# Patient Record
Sex: Male | Born: 1950
Health system: Southern US, Community
[De-identification: ages and names within clinical notes are randomized; demographics above are authoritative.]

---

## 2008-05-02 ENCOUNTER — Encounter: Admission: RE | Admit: 2008-05-02 | Discharge: 2008-05-02 | Payer: Self-pay | Admitting: Family Medicine

## 2015-08-21 DIAGNOSIS — H1032 Unspecified acute conjunctivitis, left eye: Secondary | ICD-10-CM | POA: Diagnosis not present

## 2015-11-09 DIAGNOSIS — I10 Essential (primary) hypertension: Secondary | ICD-10-CM | POA: Diagnosis not present

## 2015-11-09 DIAGNOSIS — Z0000 Encounter for general adult medical examination without abnormal findings: Secondary | ICD-10-CM | POA: Diagnosis not present

## 2015-11-09 DIAGNOSIS — Z125 Encounter for screening for malignant neoplasm of prostate: Secondary | ICD-10-CM | POA: Diagnosis not present

## 2015-11-09 DIAGNOSIS — Z23 Encounter for immunization: Secondary | ICD-10-CM | POA: Diagnosis not present

## 2015-11-09 DIAGNOSIS — E7800 Pure hypercholesterolemia, unspecified: Secondary | ICD-10-CM | POA: Diagnosis not present

## 2015-11-09 DIAGNOSIS — L918 Other hypertrophic disorders of the skin: Secondary | ICD-10-CM | POA: Diagnosis not present

## 2016-02-16 DIAGNOSIS — Z23 Encounter for immunization: Secondary | ICD-10-CM | POA: Diagnosis not present

## 2016-05-10 DIAGNOSIS — I10 Essential (primary) hypertension: Secondary | ICD-10-CM | POA: Diagnosis not present

## 2016-05-10 DIAGNOSIS — E7800 Pure hypercholesterolemia, unspecified: Secondary | ICD-10-CM | POA: Diagnosis not present

## 2016-05-10 DIAGNOSIS — L918 Other hypertrophic disorders of the skin: Secondary | ICD-10-CM | POA: Diagnosis not present

## 2016-05-17 DIAGNOSIS — H35413 Lattice degeneration of retina, bilateral: Secondary | ICD-10-CM | POA: Diagnosis not present

## 2016-05-17 DIAGNOSIS — H31009 Unspecified chorioretinal scars, unspecified eye: Secondary | ICD-10-CM | POA: Diagnosis not present

## 2016-05-17 DIAGNOSIS — H33313 Horseshoe tear of retina without detachment, bilateral: Secondary | ICD-10-CM | POA: Diagnosis not present

## 2016-05-17 DIAGNOSIS — H43811 Vitreous degeneration, right eye: Secondary | ICD-10-CM | POA: Diagnosis not present

## 2016-06-02 DIAGNOSIS — L918 Other hypertrophic disorders of the skin: Secondary | ICD-10-CM | POA: Diagnosis not present

## 2016-06-02 DIAGNOSIS — B078 Other viral warts: Secondary | ICD-10-CM | POA: Diagnosis not present

## 2016-11-10 DIAGNOSIS — E7800 Pure hypercholesterolemia, unspecified: Secondary | ICD-10-CM | POA: Diagnosis not present

## 2016-11-10 DIAGNOSIS — Z23 Encounter for immunization: Secondary | ICD-10-CM | POA: Diagnosis not present

## 2016-11-10 DIAGNOSIS — Z0000 Encounter for general adult medical examination without abnormal findings: Secondary | ICD-10-CM | POA: Diagnosis not present

## 2016-11-10 DIAGNOSIS — Z125 Encounter for screening for malignant neoplasm of prostate: Secondary | ICD-10-CM | POA: Diagnosis not present

## 2016-11-10 DIAGNOSIS — I10 Essential (primary) hypertension: Secondary | ICD-10-CM | POA: Diagnosis not present

## 2017-01-06 DIAGNOSIS — Z961 Presence of intraocular lens: Secondary | ICD-10-CM | POA: Diagnosis not present

## 2017-01-06 DIAGNOSIS — H35373 Puckering of macula, bilateral: Secondary | ICD-10-CM | POA: Diagnosis not present

## 2017-01-06 DIAGNOSIS — H35413 Lattice degeneration of retina, bilateral: Secondary | ICD-10-CM | POA: Diagnosis not present

## 2017-01-06 DIAGNOSIS — H43813 Vitreous degeneration, bilateral: Secondary | ICD-10-CM | POA: Diagnosis not present

## 2017-01-26 DIAGNOSIS — Z23 Encounter for immunization: Secondary | ICD-10-CM | POA: Diagnosis not present

## 2017-04-06 DIAGNOSIS — J069 Acute upper respiratory infection, unspecified: Secondary | ICD-10-CM | POA: Diagnosis not present

## 2017-04-12 DIAGNOSIS — J069 Acute upper respiratory infection, unspecified: Secondary | ICD-10-CM | POA: Diagnosis not present

## 2017-05-16 DIAGNOSIS — H43391 Other vitreous opacities, right eye: Secondary | ICD-10-CM | POA: Diagnosis not present

## 2017-05-16 DIAGNOSIS — H35413 Lattice degeneration of retina, bilateral: Secondary | ICD-10-CM | POA: Diagnosis not present

## 2017-05-16 DIAGNOSIS — H43811 Vitreous degeneration, right eye: Secondary | ICD-10-CM | POA: Diagnosis not present

## 2017-05-16 DIAGNOSIS — H33313 Horseshoe tear of retina without detachment, bilateral: Secondary | ICD-10-CM | POA: Diagnosis not present

## 2017-05-16 DIAGNOSIS — H31009 Unspecified chorioretinal scars, unspecified eye: Secondary | ICD-10-CM | POA: Diagnosis not present

## 2017-05-18 DIAGNOSIS — E7800 Pure hypercholesterolemia, unspecified: Secondary | ICD-10-CM | POA: Diagnosis not present

## 2017-05-18 DIAGNOSIS — I10 Essential (primary) hypertension: Secondary | ICD-10-CM | POA: Diagnosis not present

## 2017-05-18 DIAGNOSIS — R739 Hyperglycemia, unspecified: Secondary | ICD-10-CM | POA: Diagnosis not present

## 2017-07-31 DIAGNOSIS — K5792 Diverticulitis of intestine, part unspecified, without perforation or abscess without bleeding: Secondary | ICD-10-CM | POA: Diagnosis not present

## 2017-07-31 DIAGNOSIS — R109 Unspecified abdominal pain: Secondary | ICD-10-CM | POA: Diagnosis not present

## 2017-12-19 DIAGNOSIS — Z23 Encounter for immunization: Secondary | ICD-10-CM | POA: Diagnosis not present

## 2017-12-19 DIAGNOSIS — E7800 Pure hypercholesterolemia, unspecified: Secondary | ICD-10-CM | POA: Diagnosis not present

## 2017-12-19 DIAGNOSIS — Z0000 Encounter for general adult medical examination without abnormal findings: Secondary | ICD-10-CM | POA: Diagnosis not present

## 2017-12-19 DIAGNOSIS — I10 Essential (primary) hypertension: Secondary | ICD-10-CM | POA: Diagnosis not present

## 2017-12-19 DIAGNOSIS — I447 Left bundle-branch block, unspecified: Secondary | ICD-10-CM | POA: Diagnosis not present

## 2017-12-19 DIAGNOSIS — R7303 Prediabetes: Secondary | ICD-10-CM | POA: Diagnosis not present

## 2017-12-19 DIAGNOSIS — Z125 Encounter for screening for malignant neoplasm of prostate: Secondary | ICD-10-CM | POA: Diagnosis not present

## 2018-01-04 DIAGNOSIS — H35373 Puckering of macula, bilateral: Secondary | ICD-10-CM | POA: Diagnosis not present

## 2018-01-04 DIAGNOSIS — H35413 Lattice degeneration of retina, bilateral: Secondary | ICD-10-CM | POA: Diagnosis not present

## 2018-01-04 DIAGNOSIS — H26493 Other secondary cataract, bilateral: Secondary | ICD-10-CM | POA: Diagnosis not present

## 2018-01-04 DIAGNOSIS — H43813 Vitreous degeneration, bilateral: Secondary | ICD-10-CM | POA: Diagnosis not present

## 2018-01-04 DIAGNOSIS — H524 Presbyopia: Secondary | ICD-10-CM | POA: Diagnosis not present

## 2018-05-22 DIAGNOSIS — H43811 Vitreous degeneration, right eye: Secondary | ICD-10-CM | POA: Diagnosis not present

## 2018-05-22 DIAGNOSIS — H35413 Lattice degeneration of retina, bilateral: Secondary | ICD-10-CM | POA: Diagnosis not present

## 2018-05-22 DIAGNOSIS — H43391 Other vitreous opacities, right eye: Secondary | ICD-10-CM | POA: Diagnosis not present

## 2018-05-22 DIAGNOSIS — H33313 Horseshoe tear of retina without detachment, bilateral: Secondary | ICD-10-CM | POA: Diagnosis not present

## 2018-05-22 DIAGNOSIS — H31009 Unspecified chorioretinal scars, unspecified eye: Secondary | ICD-10-CM | POA: Diagnosis not present

## 2018-05-22 DIAGNOSIS — H43812 Vitreous degeneration, left eye: Secondary | ICD-10-CM | POA: Diagnosis not present

## 2018-06-25 ENCOUNTER — Other Ambulatory Visit: Payer: Self-pay

## 2018-06-25 ENCOUNTER — Other Ambulatory Visit: Payer: Self-pay | Admitting: Family Medicine

## 2018-06-25 ENCOUNTER — Ambulatory Visit
Admission: RE | Admit: 2018-06-25 | Discharge: 2018-06-25 | Disposition: A | Discharge status: Home / Self Care | Payer: Medicare HMO | Source: Ambulatory Visit | Attending: Family Medicine | Admitting: Family Medicine

## 2018-06-25 DIAGNOSIS — M25512 Pain in left shoulder: Secondary | ICD-10-CM | POA: Diagnosis not present

## 2018-06-25 DIAGNOSIS — L1230 Acquired epidermolysis bullosa, unspecified: Secondary | ICD-10-CM | POA: Diagnosis not present

## 2018-06-25 DIAGNOSIS — E7800 Pure hypercholesterolemia, unspecified: Secondary | ICD-10-CM | POA: Diagnosis not present

## 2018-06-25 DIAGNOSIS — I10 Essential (primary) hypertension: Secondary | ICD-10-CM | POA: Diagnosis not present

## 2018-06-25 DIAGNOSIS — R7303 Prediabetes: Secondary | ICD-10-CM | POA: Diagnosis not present

## 2018-12-25 DIAGNOSIS — Z23 Encounter for immunization: Secondary | ICD-10-CM | POA: Diagnosis not present

## 2018-12-25 DIAGNOSIS — Z125 Encounter for screening for malignant neoplasm of prostate: Secondary | ICD-10-CM | POA: Diagnosis not present

## 2018-12-25 DIAGNOSIS — E7800 Pure hypercholesterolemia, unspecified: Secondary | ICD-10-CM | POA: Diagnosis not present

## 2018-12-25 DIAGNOSIS — I10 Essential (primary) hypertension: Secondary | ICD-10-CM | POA: Diagnosis not present

## 2018-12-25 DIAGNOSIS — R7303 Prediabetes: Secondary | ICD-10-CM | POA: Diagnosis not present

## 2018-12-25 DIAGNOSIS — Z0000 Encounter for general adult medical examination without abnormal findings: Secondary | ICD-10-CM | POA: Diagnosis not present

## 2018-12-25 DIAGNOSIS — M25512 Pain in left shoulder: Secondary | ICD-10-CM | POA: Diagnosis not present

## 2019-01-02 ENCOUNTER — Encounter: Payer: Self-pay | Admitting: Physician Assistant

## 2019-01-02 ENCOUNTER — Ambulatory Visit (INDEPENDENT_AMBULATORY_CARE_PROVIDER_SITE_OTHER): Payer: Medicare HMO | Admitting: Physician Assistant

## 2019-01-02 DIAGNOSIS — G8929 Other chronic pain: Secondary | ICD-10-CM | POA: Diagnosis not present

## 2019-01-02 DIAGNOSIS — M25512 Pain in left shoulder: Secondary | ICD-10-CM | POA: Diagnosis not present

## 2019-01-02 MED ORDER — METHYLPREDNISOLONE ACETATE 40 MG/ML IJ SUSP
40.0000 mg | INTRAMUSCULAR | Status: AC | PRN
  Administered 2019-01-02: 40 mg via INTRA_ARTICULAR

## 2019-01-02 MED ORDER — LIDOCAINE HCL 1 % IJ SOLN
3.0000 mL | INTRAMUSCULAR | Status: AC | PRN
  Administered 2019-01-02: 3 mL

## 2019-01-02 NOTE — Progress Notes (Signed)
Office Visit Note   Patient: Juan Moore           Date of Birth: 08-11-1950           MRN: [id number] Visit Date: 01/02/2019              Requested by: Shirline Frees, MD [address],  [place] [address] PCP: Shirline Frees, MD   Assessment & Plan: Visit Diagnoses:  1. Chronic left shoulder pain     Plan:   Discussed with Ed that we will see how the injection does in regards to his pain.  He is given wall crawls, pendulum, Codman and forward flexion exercises performed.  If his pain does not resolve or return shortly after the injection recommend MRI to evaluate the rotator cuff.  Questions encouraged and answered.  Follow-Up Instructions: Return if symptoms worsen or fail to improve.   Orders:  Orders Placed This Encounter  Procedures  . Large Joint Inj: L subacromial bursa   No orders of the defined types were placed in this encounter.     Procedures: Large Joint Inj: L subacromial bursa on 01/02/2019 5:03 PM Indications: pain Details: 22 G 1.5 in needle, superior approach  Arthrogram: No  Medications: 3 mL lidocaine 1 %; 40 mg methylPREDNISolone acetate 40 MG/ML Outcome: tolerated well, no immediate complications Procedure, treatment alternatives, risks and benefits explained, specific risks discussed. Consent was given by the patient. Immediately prior to procedure a time out was called to verify the correct patient, procedure, equipment, support staff and site/side marked as required. Patient was prepped and draped in the usual sterile fashion.       Clinical Data: No additional findings.   Subjective: Chief Complaint  Patient presents with  . Left Shoulder - Pain    HPI And is a 68 year old male well-known to me personally.  Comes in today with left shoulder pain is been ongoing for several months now at least since March.  He had radiographs at his primary care physician which I was able to review these were done back in March  3 views of the shoulder show no acute fracture no bony abnormalities.  Shoulders well located.  Glenohumeral joint appears well maintained.  States that the left shoulder pain began after his wife was lying on his arm for a while.  No really numbness or tingling down the arm.  Pain is mostly in the shoulder girdle.  He is having no neck pain.  States he has good range of motion but extremes will cause some discomfort.  Review of Systems No fevers chills shortness of breath chest pain  Objective: Vital Signs: There were no vitals taken for this visit.  Physical Exam Constitutional:      Appearance: He is not ill-appearing or diaphoretic.  Pulmonary:     Effort: Pulmonary effort is normal.  Neurological:     Mental Status: He is alert and oriented to person, place, and time.  Psychiatric:        Mood and Affect: Mood normal.     Ortho Exam Positive impingement left shoulder.  5 out of 5 strength with external and internal rotation is resistance.  Empty can test is negative bilaterally.  Liftoff test negative bilaterally. Specialty Comments:  No specialty comments available.  Imaging: No results found.   PMFS History: There are no active problems to display for this patient.  History reviewed. No pertinent past medical history.  History reviewed. No pertinent family  history.  History reviewed. No pertinent surgical history. Social History   Occupational History  . Not on file  Tobacco Use  . Smoking status: Not on file  Substance and Sexual Activity  . Alcohol use: Not on file  . Drug use: Not on file  . Sexual activity: Not on file

## 2019-01-08 DIAGNOSIS — H35363 Drusen (degenerative) of macula, bilateral: Secondary | ICD-10-CM | POA: Diagnosis not present

## 2019-01-08 DIAGNOSIS — H35413 Lattice degeneration of retina, bilateral: Secondary | ICD-10-CM | POA: Diagnosis not present

## 2019-01-08 DIAGNOSIS — H33313 Horseshoe tear of retina without detachment, bilateral: Secondary | ICD-10-CM | POA: Diagnosis not present

## 2019-01-08 DIAGNOSIS — H524 Presbyopia: Secondary | ICD-10-CM | POA: Diagnosis not present

## 2019-01-08 DIAGNOSIS — H35372 Puckering of macula, left eye: Secondary | ICD-10-CM | POA: Diagnosis not present

## 2019-04-09 DIAGNOSIS — L570 Actinic keratosis: Secondary | ICD-10-CM | POA: Diagnosis not present

## 2019-04-09 DIAGNOSIS — L989 Disorder of the skin and subcutaneous tissue, unspecified: Secondary | ICD-10-CM | POA: Diagnosis not present

## 2019-04-09 DIAGNOSIS — L821 Other seborrheic keratosis: Secondary | ICD-10-CM | POA: Diagnosis not present

## 2019-05-10 ENCOUNTER — Ambulatory Visit: Payer: Medicare HMO

## 2019-05-18 ENCOUNTER — Ambulatory Visit: Payer: Medicare HMO | Attending: Internal Medicine

## 2019-05-18 DIAGNOSIS — Z23 Encounter for immunization: Secondary | ICD-10-CM | POA: Insufficient documentation

## 2019-05-18 NOTE — Progress Notes (Signed)
   Covid-19 Vaccination Clinic  Name:  Juan Moore    MRN: [id number] DOB: 06-May-1950  05/18/2019  Juan Moore was observed post Covid-19 immunization for 15 minutes without incidence. He was provided with Vaccine Information Sheet and instruction to access the V-Safe system.   Juan Moore was instructed to call 911 with any severe reactions post vaccine: Marland Kitchen Difficulty breathing  . Swelling of your face and throat  . A fast heartbeat  . A bad rash all over your body  . Dizziness and weakness    Immunizations Administered    Name Date Dose VIS Date Route   Pfizer COVID-19 Vaccine 05/18/2019  4:30 PM 0.3 mL 03/22/2019 Intramuscular   Manufacturer: [employer], [employer]   Lot: [id number]   NDC: [address]-7

## 2019-05-31 ENCOUNTER — Ambulatory Visit: Payer: Medicare HMO

## 2019-06-12 ENCOUNTER — Ambulatory Visit: Payer: Medicare HMO | Attending: Internal Medicine

## 2019-06-12 DIAGNOSIS — Z23 Encounter for immunization: Secondary | ICD-10-CM | POA: Insufficient documentation

## 2019-06-12 NOTE — Progress Notes (Signed)
   Covid-19 Vaccination Clinic  Name:  Juan Moore    MRN: [id number] DOB: November 22, 1950  06/12/2019  [name] was observed post Covid-19 immunization for 15 minutes without incident. He was provided with Vaccine Information Sheet and instruction to access the V-Safe system.   [name] was instructed to call 911 with any severe reactions post vaccine: Marland Kitchen Difficulty breathing  . Swelling of face and throat  . A fast heartbeat  . A bad rash all over body  . Dizziness and weakness   Immunizations Administered    Name Date Dose VIS Date Route   Pfizer COVID-19 Vaccine 06/12/2019 12:47 PM 0.3 mL 03/22/2019 Intramuscular   Manufacturer: [employer], [employer]   Lot: [id number]   NDC: [address]-8

## 2019-11-30 ENCOUNTER — Inpatient Hospital Stay (HOSPITAL_COMMUNITY)
Admission: EM | Admit: 2019-11-30 | Discharge: 2019-12-07 | DRG: 378 | Disposition: A | Discharge status: Home / Self Care | Payer: Medicare HMO | Attending: Internal Medicine | Admitting: Internal Medicine

## 2019-11-30 ENCOUNTER — Other Ambulatory Visit: Payer: Self-pay

## 2019-11-30 ENCOUNTER — Encounter (HOSPITAL_COMMUNITY): Payer: Self-pay | Admitting: Emergency Medicine

## 2019-11-30 DIAGNOSIS — Z7982 Long term (current) use of aspirin: Secondary | ICD-10-CM | POA: Diagnosis not present

## 2019-11-30 DIAGNOSIS — K633 Ulcer of intestine: Secondary | ICD-10-CM | POA: Diagnosis present

## 2019-11-30 DIAGNOSIS — K649 Unspecified hemorrhoids: Secondary | ICD-10-CM | POA: Diagnosis present

## 2019-11-30 DIAGNOSIS — K5731 Diverticulosis of large intestine without perforation or abscess with bleeding: Secondary | ICD-10-CM | POA: Diagnosis not present

## 2019-11-30 DIAGNOSIS — R195 Other fecal abnormalities: Secondary | ICD-10-CM | POA: Diagnosis present

## 2019-11-30 DIAGNOSIS — D72829 Elevated white blood cell count, unspecified: Secondary | ICD-10-CM | POA: Diagnosis present

## 2019-11-30 DIAGNOSIS — I10 Essential (primary) hypertension: Secondary | ICD-10-CM | POA: Diagnosis present

## 2019-11-30 DIAGNOSIS — Z79899 Other long term (current) drug therapy: Secondary | ICD-10-CM

## 2019-11-30 DIAGNOSIS — K559 Vascular disorder of intestine, unspecified: Secondary | ICD-10-CM | POA: Diagnosis present

## 2019-11-30 DIAGNOSIS — I447 Left bundle-branch block, unspecified: Secondary | ICD-10-CM

## 2019-11-30 DIAGNOSIS — Z20822 Contact with and (suspected) exposure to covid-19: Secondary | ICD-10-CM | POA: Diagnosis present

## 2019-11-30 DIAGNOSIS — D62 Acute posthemorrhagic anemia: Secondary | ICD-10-CM | POA: Diagnosis present

## 2019-11-30 DIAGNOSIS — K922 Gastrointestinal hemorrhage, unspecified: Secondary | ICD-10-CM | POA: Diagnosis not present

## 2019-11-30 DIAGNOSIS — K625 Hemorrhage of anus and rectum: Secondary | ICD-10-CM | POA: Diagnosis not present

## 2019-11-30 DIAGNOSIS — E785 Hyperlipidemia, unspecified: Secondary | ICD-10-CM | POA: Diagnosis present

## 2019-11-30 DIAGNOSIS — D649 Anemia, unspecified: Secondary | ICD-10-CM

## 2019-11-30 LAB — CBC
HCT: 35.8 % — ABNORMAL LOW (ref 39.0–52.0)
HCT: 32.1 % — ABNORMAL LOW (ref 39.0–52.0)
HCT: 29.4 % — ABNORMAL LOW (ref 39.0–52.0)
Hemoglobin: 11.7 g/dL — ABNORMAL LOW (ref 13.0–17.0)
Hemoglobin: 10.3 g/dL — ABNORMAL LOW (ref 13.0–17.0)
Hemoglobin: 9.4 g/dL — ABNORMAL LOW (ref 13.0–17.0)
MCH: 29.7 pg (ref 26.0–34.0)
MCH: 29.4 pg (ref 26.0–34.0)
MCH: 29.3 pg (ref 26.0–34.0)
MCHC: 32.7 g/dL (ref 30.0–36.0)
MCHC: 32.1 g/dL (ref 30.0–36.0)
MCHC: 32 g/dL (ref 30.0–36.0)
MCV: 90.9 fL (ref 80.0–100.0)
MCV: 91.7 fL (ref 80.0–100.0)
MCV: 91.6 fL (ref 80.0–100.0)
Platelets: 280 10*3/uL (ref 150–400)
Platelets: 263 10*3/uL (ref 150–400)
Platelets: 238 10*3/uL (ref 150–400)
RBC: 3.94 MIL/uL — ABNORMAL LOW (ref 4.22–5.81)
RBC: 3.5 MIL/uL — ABNORMAL LOW (ref 4.22–5.81)
RBC: 3.21 MIL/uL — ABNORMAL LOW (ref 4.22–5.81)
RDW: 13 % (ref 11.5–15.5)
RDW: 13.1 % (ref 11.5–15.5)
RDW: 13.1 % (ref 11.5–15.5)
WBC: 12.1 10*3/uL — ABNORMAL HIGH (ref 4.0–10.5)
WBC: 12.2 10*3/uL — ABNORMAL HIGH (ref 4.0–10.5)
WBC: 12 10*3/uL — ABNORMAL HIGH (ref 4.0–10.5)
nRBC: 0 % (ref 0.0–0.2)
nRBC: 0 % (ref 0.0–0.2)
nRBC: 0 % (ref 0.0–0.2)

## 2019-11-30 LAB — ABO/RH: ABO/RH(D): B POS

## 2019-11-30 LAB — COMPREHENSIVE METABOLIC PANEL
ALT: 16 U/L (ref 0–44)
AST: 23 U/L (ref 15–41)
Albumin: 4 g/dL (ref 3.5–5.0)
Alkaline Phosphatase: 53 U/L (ref 38–126)
Anion gap: 12 (ref 5–15)
BUN: 16 mg/dL (ref 8–23)
CO2: 25 mmol/L (ref 22–32)
Calcium: 9.6 mg/dL (ref 8.9–10.3)
Chloride: 101 mmol/L (ref 98–111)
Creatinine, Ser: 0.89 mg/dL (ref 0.61–1.24)
GFR calc Af Amer: >60 mL/min (ref >60)
GFR calc non Af Amer: >60 mL/min (ref >60)
Glucose, Bld: 112 mg/dL — ABNORMAL HIGH (ref 70–99)
Potassium: 4.5 mmol/L (ref 3.5–5.1)
Sodium: 138 mmol/L (ref 135–145)
Total Bilirubin: 0.8 mg/dL (ref 0.3–1.2)
Total Protein: 6.8 g/dL (ref 6.5–8.1)

## 2019-11-30 LAB — PROTIME-INR
INR: 1.2 (ref 0.8–1.2)
Prothrombin Time: 14.6 seconds (ref 11.4–15.2)

## 2019-11-30 LAB — APTT: aPTT: 23 seconds — ABNORMAL LOW (ref 24–36)

## 2019-11-30 LAB — SARS CORONAVIRUS 2 BY RT PCR (HOSPITAL ORDER, PERFORMED IN ~~LOC~~ HOSPITAL LAB): SARS Coronavirus 2: NEGATIVE

## 2019-11-30 LAB — POC OCCULT BLOOD, ED: Fecal Occult Bld: POSITIVE — AB

## 2019-11-30 LAB — PREPARE RBC (CROSSMATCH)

## 2019-11-30 MED ORDER — PANTOPRAZOLE SODIUM 40 MG IV SOLR
40.0000 mg | INTRAVENOUS | Status: DC
  Administered 2019-12-01: 40 mg via INTRAVENOUS
  Filled 2019-11-30 (×2): qty 40

## 2019-11-30 MED ORDER — LACTATED RINGERS IV BOLUS
500.0000 mL | Freq: Once | INTRAVENOUS | Status: AC
  Administered 2019-11-30: 500 mL via INTRAVENOUS

## 2019-11-30 MED ORDER — ACETAMINOPHEN 325 MG PO TABS
650.0000 mg | ORAL_TABLET | Freq: Four times a day (QID) | ORAL | Status: DC | PRN
  Administered 2019-12-02 – 2019-12-03 (×2): 650 mg via ORAL
  Filled 2019-11-30 (×2): qty 2

## 2019-11-30 MED ORDER — LACTATED RINGERS IV SOLN: INTRAVENOUS | Status: DC

## 2019-11-30 MED ORDER — SODIUM CHLORIDE 0.9% IV SOLUTION: Freq: Once | INTRAVENOUS | Status: DC

## 2019-11-30 MED ORDER — ACETAMINOPHEN 650 MG RE SUPP: 650.0000 mg | Freq: Four times a day (QID) | RECTAL | Status: DC | PRN

## 2019-11-30 NOTE — ED Triage Notes (Signed)
Pt reports bright red rectal bleeding since Wednesday.  Seen at [facility] today and sent to ED for further eval.  Denies pain.

## 2019-11-30 NOTE — ED Notes (Signed)
Ortho from 13:09 not obtained. Provider notified and stated order can be discontinued.

## 2019-11-30 NOTE — Consult Note (Signed)
Referring Provider:  Dr. Anitra Lauth [facility] EDP) Primary Care Physician:  Johny Blamer, MD Primary Gastroenterologist:  Dr. Dulce Sellar  Reason for Consultation: Hematochezia  HPI: Juan Moore is a 69 y.o. male with known pancolonic diverticulosis based on a June 2021 colonoscopy which was otherwise negative, who presented to the [facility] walk-in clinic today with a history of several episodes of painless hematochezia which began 3 nights ago, associated with orthostatic symptomatology.    I spoke with the walk-in clinic physician and recommended referral to the [facility] emergency room.  After a 7-hour wait, he was taken back to a room, having had in the meantime multiple additional episodes of hematochezia (most recently about 15 minutes ago), and with a drop in his hemoglobin to 10.3 (as compared to a baseline of 15.1 when checked as an outpatient a year ago).   No abdominal pain.  No history of antecedent constipation to suggest stercoral ulceration.  No use of nonsteroidal anti-inflammatory drugs to have caused colonic ulceration.  The patient is on a daily 81 mg aspirin without PPI coverage but his BUN on admission was 16 which would argue against an upper tract source, as would the fact that his stool is not melenic in character.  No prodromal dyspeptic symptomatology.  Platelets normal, no known liver disease, no anticoagulant therapy, no prior history of GI bleeding other than some self-limited hemorrhoidal bleeding evaluated in the ER about 5 years ago.   History reviewed. No pertinent past medical history.  History reviewed. No pertinent surgical history.  Prior to Admission medications   Medication Sig Start Date End Date Taking? Authorizing Provider  acetaminophen (TYLENOL) 500 MG tablet Take 1,000 mg by mouth every 6 (six) hours as needed for headache (pain).   Yes [provider]  ascorbic acid (VITAMIN C) 500 MG tablet Take 500 mg by mouth daily.   Yes [provider]   aspirin EC 81 MG tablet Take 81 mg by mouth daily. Swallow whole.   Yes [provider]  benazepril-hydrochlorthiazide (LOTENSIN HCT) 10-12.5 MG tablet Take 1 tablet by mouth daily.  08/09/18  Yes [provider]  hydrocortisone (ANUSOL-HC) 25 MG suppository Place 25 mg rectally 2 (two) times daily. 11/28/19  Yes [provider]  Menthol, Topical Analgesic, (ICY HOT EX) Apply 1 application topically daily as needed (shoulder pain).   Yes [provider]  Multiple Vitamin (MULTIVITAMIN WITH MINERALS) TABS tablet Take 1 tablet by mouth daily.   Yes [provider]  Probiotic Product (ALIGN PO) Take 1 tablet by mouth.   Yes [provider]  simvastatin (ZOCOR) 40 MG tablet Take 40 mg by mouth at bedtime.  08/16/18  Yes [provider]    No current facility-administered medications for this encounter.   Current Outpatient Medications  Medication Sig Dispense Refill  . acetaminophen (TYLENOL) 500 MG tablet Take 1,000 mg by mouth every 6 (six) hours as needed for headache (pain).    Marland Kitchen ascorbic acid (VITAMIN C) 500 MG tablet Take 500 mg by mouth daily.    Marland Kitchen aspirin EC 81 MG tablet Take 81 mg by mouth daily. Swallow whole.    . benazepril-hydrochlorthiazide (LOTENSIN HCT) 10-12.5 MG tablet Take 1 tablet by mouth daily.     . hydrocortisone (ANUSOL-HC) 25 MG suppository Place 25 mg rectally 2 (two) times daily.    . Menthol, Topical Analgesic, (ICY HOT EX) Apply 1 application topically daily as needed (shoulder pain).    . Multiple Vitamin (MULTIVITAMIN WITH  MINERALS) TABS tablet Take 1 tablet by mouth daily.    . Probiotic Product (ALIGN PO) Take 1 tablet by mouth.    . simvastatin (ZOCOR) 40 MG tablet Take 40 mg by mouth at bedtime.       Allergies as of 11/30/2019  . (No Known Allergies)    No family history on file.  Social History   Socioeconomic History  . Marital status: Unknown    Spouse name: Not on file  . Number of  children: Not on file  . Years of education: Not on file  . Highest education level: Not on file  Occupational History  . Not on file  Tobacco Use  . Smoking status: Never Smoker  . Smokeless tobacco: Never Used  Substance and Sexual Activity  . Alcohol use: Not Currently  . Drug use: Not Currently  . Sexual activity: Not on file  Other Topics Concern  . Not on file  Social History Narrative  . Not on file   Social Determinants of Health   Financial Resource Strain:   . Difficulty of Paying Living Expenses: Not on file  Food Insecurity:   . Worried About Programme researcher, broadcasting/film/video in the Last Year: Not on file  . Ran Out of Food in the Last Year: Not on file  Transportation Needs:   . Lack of Transportation (Medical): Not on file  . Lack of Transportation (Non-Medical): Not on file  Physical Activity:   . Days of Exercise per Week: Not on file  . Minutes of Exercise per Session: Not on file  Stress:   . Feeling of Stress : Not on file  Social Connections:   . Frequency of Communication with Friends and Family: Not on file  . Frequency of Social Gatherings with Friends and Family: Not on file  . Attends Religious Services: Not on file  . Active Member of Clubs or Organizations: Not on file  . Attends Banker Meetings: Not on file  . Marital Status: Not on file  Intimate Partner Violence:   . Fear of Current or Ex-Partner: Not on file  . Emotionally Abused: Not on file  . Physically Abused: Not on file  . Sexually Abused: Not on file    Review of Systems: See HPI  Physical Exam: Vital signs in last 24 hours: Temp:  [98.4 F (36.9 C)] 98.4 F (36.9 C) (08/21 1304) Pulse Rate:  [87-128] 128 (08/21 2040) Resp:  [16-18] 18 (08/21 2040) BP: (105-130)/(76-95) 105/76 (08/21 2040) SpO2:  [96 %-99 %] 98 % (08/21 2040) Weight:  [93 kg] 93 kg (08/21 1304)   General:   Alert,  Well-developed, well-nourished, pleasant and cooperative in NAD Head:  Normocephalic and  atraumatic. Eyes:  Sclera clear, no icterus.   Conjunctiva pink. Mouth:   No ulcerations or lesions.  Oropharynx pink & moist. Neck:   No masses or thyromegaly. Lungs:  Clear throughout to auscultation.   No wheezes, crackles, or rhonchi. No evident respiratory distress. Heart:   Regular rate and rhythm; no murmurs, clicks, rubs,  or gallops. Abdomen:  Soft, nontender, nontympanitic, and nondistended. No masses, hepatosplenomegaly or ventral hernias noted. Msk:   Symmetrical without gross deformities. Pulses:  Normal radial pulse is noted. Extremities:   Without clubbing, cyanosis, or edema. Neurologic:  Alert and coherent;  grossly normal neurologically. Skin:  Intact without significant lesions or rashes.  Warm and well perfused. Cervical Nodes:  No significant cervical adenopathy. Psych:   Alert and cooperative. Normal  mood and affect.  Intake/Output from previous day: No intake/output data recorded. Intake/Output this shift: No intake/output data recorded.  Lab Results: Recent Labs    11/30/19 1316 11/30/19 2021  WBC 12.1* 12.2*  HGB 11.7* 10.3*  HCT 35.8* 32.1*  PLT 280 263   BMET Recent Labs    11/30/19 1316  NA 138  K 4.5  CL 101  CO2 25  GLUCOSE 112*  BUN 16  CREATININE 0.89  CALCIUM 9.6   LFT Recent Labs    11/30/19 1316  PROT 6.8  ALBUMIN 4.0  AST 23  ALT 16  ALKPHOS 53  BILITOT 0.8   PT/INR No results for input(s): LABPROT, INR in the last 72 hours.  Studies/Results: No results found.  Impression: Very compatible presentation for active diverticular bleeding with mild incipient hemodynamic instability and moderate acute posthemorrhagic anemia.  No evidence of upper tract source with normal BUN, despite low-dose aspirin therapy.  Alternative sources of lower GI bleeding, such as vascular ectasia, colonic neoplasia, ischemic colitis or colonic ulcerations do not fit the history or clinical presentation appropriately.  Plan: 1.  Nuclear medicine  bleeding scan; if active bleeding is identified, proceed to IR embolization (renal function normal)  2.  Meanwhile, supportive care with IV fluids  3.  I have ordered standard dose IV pantoprazole to prophylax against stress gastritis in the setting of recent aspirin use, although I do not think the patient's current bleeding is of upper tract origin.  4.  Lengthy discussion with patient and wife at bedside regarding pathophysiology, natural history, prognosis, and management of diverticular bleeding which is our working diagnosis.  I explained that there is usually not much of a role for colonoscopy in this setting, but we would consider it if the bleeding persists and/or the diagnosis is in doubt.  We will follow the patient with you.   LOS: 0 days   Katy Fitch Kalum Minner  11/30/2019, 10:05 PM   Pager [phone] If no answer or after 5 PM call [phone]

## 2019-11-30 NOTE — H&P (Signed)
History and Physical    Juan Moore:[id number] DOB: 22-Jul-1950 DOA: 11/30/2019  PCP: Johny Blamer, MD Patient coming from: Home  Chief Complaint: Rectal bleeding  HPI: Juan Moore is a 69 y.o. male with medical history significant of hypertension, hyperlipidemia, pancolonic diverticulosis on colonoscopy done June 2021 per GI note being sent to the ED by [facility] for evaluation of rectal bleeding for the past 3 days.  He takes aspirin 81 mg daily at home.  Patient states he has had multiple episodes of rectal bleeding at home and 3-4 additional episodes since he has been in the ED.  Today he is feeling lightheaded and appears pale.  Denies abdominal or rectal pain.  He has not vomited any blood.  He has been vaccinated against Covid.  Denies fevers, cough, chest pain, or shortness of breath.    ED Course: Patient had additional episodes of hematochezia in the ED.  Tachycardic.  Not hypotensive.  Initial hemoglobin 11.7, repeat 10.3.  Per GI note, his hemoglobin was 15.1 when checked as an outpatient a year ago.  FOBT positive.  Platelet count normal.  SARS-CoV-2 PCR test pending.  He was given a 500 cc LR bolus.  Dr. Matthias Hughs from GI consulted by ED provider and recommended a tagged RBC scan.  Review of Systems:  All systems reviewed and apart from history of presenting illness, are negative.  History reviewed. No pertinent past medical history.  History reviewed. No pertinent surgical history.   reports that he has never smoked. He has never used smokeless tobacco. He reports previous alcohol use. He reports previous drug use.  No Known Allergies  History reviewed. No pertinent family history.  Prior to Admission medications   Medication Sig Start Date End Date Taking? Authorizing Provider  acetaminophen (TYLENOL) 500 MG tablet Take 1,000 mg by mouth every 6 (six) hours as needed for headache (pain).   Yes [provider]  ascorbic acid (VITAMIN C) 500 MG tablet  Take 500 mg by mouth daily.   Yes [provider]  aspirin EC 81 MG tablet Take 81 mg by mouth daily. Swallow whole.   Yes [provider]  benazepril-hydrochlorthiazide (LOTENSIN HCT) 10-12.5 MG tablet Take 1 tablet by mouth daily.  08/09/18  Yes [provider]  hydrocortisone (ANUSOL-HC) 25 MG suppository Place 25 mg rectally 2 (two) times daily. 11/28/19  Yes [provider]  Menthol, Topical Analgesic, (ICY HOT EX) Apply 1 application topically daily as needed (shoulder pain).   Yes [provider]  Multiple Vitamin (MULTIVITAMIN WITH MINERALS) TABS tablet Take 1 tablet by mouth daily.   Yes [provider]  Probiotic Product (ALIGN PO) Take 1 tablet by mouth.   Yes [provider]  simvastatin (ZOCOR) 40 MG tablet Take 40 mg by mouth at bedtime.  08/16/18  Yes [provider]    Physical Exam: Vitals:   11/30/19 1600 11/30/19 1624 11/30/19 1805 11/30/19 2040  BP: 121/83 116/80 (!) 119/95 105/76  Pulse: 87 94 (!) 110 (!) 128  Resp: 16 18 18 18   Temp:      TempSrc:      SpO2: 99% 98% 96% 98%  Weight:      Height:        Physical Exam Constitutional:      General: He is not in acute distress. HENT:     Head: Normocephalic and atraumatic.     Mouth/Throat:     Mouth: Mucous membranes are moist.  Pharynx: Oropharynx is clear.  Eyes:     Extraocular Movements: Extraocular movements intact.     Conjunctiva/sclera: Conjunctivae normal.  Cardiovascular:     Rate and Rhythm: Regular rhythm. Tachycardia present.     Pulses: Normal pulses.     Comments: Slightly tachycardic Pulmonary:     Effort: Pulmonary effort is normal. No respiratory distress.     Breath sounds: Normal breath sounds. No wheezing or rales.  Abdominal:     General: Bowel sounds are normal. There is no distension.     Palpations: Abdomen is soft.     Tenderness: There is no abdominal tenderness. There is no guarding or rebound.    Musculoskeletal:        General: No swelling or tenderness.     Cervical back: Normal range of motion and neck supple.  Skin:    General: Skin is warm and dry.     Coloration: Skin is pale.  Neurological:     General: No focal deficit present.     Mental Status: He is alert and oriented to person, place, and time.     Labs on Admission: I have personally reviewed following labs and imaging studies  CBC: Recent Labs  Lab 11/30/19 1316 11/30/19 2021  WBC 12.1* 12.2*  HGB 11.7* 10.3*  HCT 35.8* 32.1*  MCV 90.9 91.7  PLT 280 263   Basic Metabolic Panel: Recent Labs  Lab 11/30/19 1316  NA 138  K 4.5  CL 101  CO2 25  GLUCOSE 112*  BUN 16  CREATININE 0.89  CALCIUM 9.6   GFR: Estimated Creatinine Clearance: 86 mL/min (by C-G formula based on SCr of 0.89 mg/dL). Liver Function Tests: Recent Labs  Lab 11/30/19 1316  AST 23  ALT 16  ALKPHOS 53  BILITOT 0.8  PROT 6.8  ALBUMIN 4.0   No results for input(s): LIPASE, AMYLASE in the last 168 hours. No results for input(s): AMMONIA in the last 168 hours. Coagulation Profile: No results for input(s): INR, PROTIME in the last 168 hours. Cardiac Enzymes: No results for input(s): CKTOTAL, CKMB, CKMBINDEX, TROPONINI in the last 168 hours. BNP (last 3 results) No results for input(s): PROBNP in the last 8760 hours. HbA1C: No results for input(s): HGBA1C in the last 72 hours. CBG: No results for input(s): GLUCAP in the last 168 hours. Lipid Profile: No results for input(s): CHOL, HDL, LDLCALC, TRIG, CHOLHDL, LDLDIRECT in the last 72 hours. Thyroid Function Tests: No results for input(s): TSH, T4TOTAL, FREET4, T3FREE, THYROIDAB in the last 72 hours. Anemia Panel: No results for input(s): VITAMINB12, FOLATE, FERRITIN, TIBC, IRON, RETICCTPCT in the last 72 hours. Urine analysis: No results found for: COLORURINE, APPEARANCEUR, LABSPEC, PHURINE, GLUCOSEU, HGBUR, BILIRUBINUR, KETONESUR, PROTEINUR, UROBILINOGEN, NITRITE,  LEUKOCYTESUR  Radiological Exams on Admission: No results found.  EKG: Independently reviewed.  Sinus tachycardia, LBBB.  No prior tracing for comparison.  Assessment/Plan Principal Problem:   Acute lower GI bleeding Active Problems:   Acute blood loss anemia   Leukocytosis   Hypertension   Left bundle branch block   Acute symptomatic blood loss anemia secondary to acute lower GI bleed/painless hematochezia: History of pancolonic diverticulosis on colonoscopy done June 2021 per GI note.  Takes aspirin 81 mg daily at home.  He has had multiple episodes of hematochezia at home and continues to have additional episodes in the ED.  Initial hemoglobin 11.7, repeat 10.3.  FOBT positive.  Per GI note, his hemoglobin was 15.1 when checked as an outpatient a year  ago.  Currently tachycardic but not hypotensive.  Patient has been seen by GI and his bleeding is felt to be diverticular in nature.  Recommending tagged RBC scan to see if there is active bleeding, if so IR consultation for embolization. -Keep n.p.o. IV fluid bolus and infusion.  Type and screen, 2 units PRBCs.  Serial H&H.  Tagged RBC scan ordered and pending.  Hold home aspirin.  GI ordered IV PPI for prophylaxis against stress gastritis in the setting of recent aspirin use.  Admit to progressive care unit and monitor hemodynamics very closely.  Mild leukocytosis: Likely reactive.  WBC count 12.1.  No fever or infectious signs/symptoms. -Continue to monitor WBC count, pursue work-up to rule out infectious etiology if no improvement.  Hypertension -Hold antihypertensives at this time  Hyperlipidemia -Resume home statin after resolution of acute GI bleed/when patient is no longer n.p.o.  Left bundle branch block on EKG: No prior EKG for comparison.  Discussed with the patient.  Patient is not endorsing any anginal symptoms.  He reports previously being told that he has a left bundle branch block on his EKG for several years and his PCP  is aware.  States he has also previously been seen by a cardiologist. -Cardiac monitoring  DVT prophylaxis: SCDs Code Status: Full code-discussed with the patient. Family Communication: Wife at bedside. Disposition Plan: Status is: Inpatient  Remains inpatient appropriate because:Hemodynamically unstable, IV treatments appropriate due to intensity of illness or inability to take PO and Inpatient level of care appropriate due to severity of illness   Dispo: The patient is from: Home              Anticipated d/c is to: Home              Anticipated d/c date is: 2 days              Patient currently is not medically stable to d/c.  Consults called: GI (Dr. Matthias Hughs)  The medical decision making on this patient was of high complexity and the patient is at high risk for clinical deterioration, therefore this is a level 3 visit.  John Giovanni MD [facility]  If 7PM-7AM, please contact night-coverage [email or web address]  11/30/2019, 10:41 PM

## 2019-11-30 NOTE — ED Provider Notes (Signed)
[facility] EMERGENCY DEPARTMENT Provider Note   CSN: [id number] Arrival date & time: 11/30/19  1233     History Chief Complaint  Patient presents with  . Rectal Bleeding    Juan Moore is a 69 y.o. male.  The history is provided by the patient.  Rectal Bleeding Quality:  Bright red (pt states started to have painless bright red bleeding on wed.  intermittent and maybe somewhat improved yesterday but worse in the last 24 hours.  6 large bloody BM;s today) Amount:  Copious Duration:  4 days Timing:  Intermittent Chronicity:  New Context: defecation   Similar prior episodes: no   Relieved by:  Nothing Worsened by:  Defecation Ineffective treatments:  Hemorrhoid cream Associated symptoms: light-headedness   Associated symptoms: no abdominal pain, no dizziness, no fever, no loss of consciousness, no recent illness and no vomiting   Risk factors comment:  81mg  ASA but no other anticoagulation.  Colonscopy on 6/21 and normal except for internal hemorrhoids and diverticulosis      History reviewed. No pertinent past medical history.  There are no problems to display for this patient.   History reviewed. No pertinent surgical history.     No family history on file.  Social History   Tobacco Use  . Smoking status: Never Smoker  . Smokeless tobacco: Never Used  Substance Use Topics  . Alcohol use: Not Currently  . Drug use: Not Currently    Home Medications Prior to Admission medications   Medication Sig Start Date End Date Taking? Authorizing Provider  benazepril-hydrochlorthiazide (LOTENSIN HCT) 10-12.5 MG tablet  08/09/18   [provider]  simvastatin (ZOCOR) 40 MG tablet  08/16/18   [provider]    Allergies    Patient has no allergy information on record.  Review of Systems   Review of Systems  Constitutional: Negative for fever.  Gastrointestinal: Positive for hematochezia. Negative for abdominal pain and  vomiting.  Neurological: Positive for light-headedness. Negative for dizziness and loss of consciousness.  All other systems reviewed and are negative.   Physical Exam Updated Vital Signs BP 105/76 (BP Location: Right Arm)   Pulse (!) 128   Temp 98.4 F (36.9 C) (Oral)   Resp 18   Ht 6' (1.829 m)   Wt 93 kg   SpO2 98%   BMI 27.80 kg/m   Physical Exam Vitals and nursing note reviewed.  Constitutional:      General: He is not in acute distress.    Appearance: Normal appearance. He is well-developed and normal weight.  HENT:     Head: Normocephalic and atraumatic.     Mouth/Throat:     Mouth: Mucous membranes are moist.  Eyes:     Conjunctiva/sclera: Conjunctivae normal.     Pupils: Pupils are equal, round, and reactive to light.  Cardiovascular:     Rate and Rhythm: Regular rhythm. Tachycardia present.     Heart sounds: No murmur heard.   Pulmonary:     Effort: Pulmonary effort is normal. Tachypnea present. No respiratory distress.     Breath sounds: Normal breath sounds. No wheezing or rales.     Comments: tachypneic when standing that improves with laying down Abdominal:     General: There is no distension.     Palpations: Abdomen is soft.     Tenderness: There is no abdominal tenderness. There is no guarding or rebound.  Genitourinary:    Rectum: Guaiac result positive.     Comments:  No external hemorrhoids.  Dark blood present on rectal.  No masses or tenderness noted. Musculoskeletal:        General: No tenderness. Normal range of motion.     Cervical back: Normal range of motion and neck supple.  Skin:    General: Skin is warm and dry.     Coloration: Skin is pale.     Findings: No erythema or rash.  Neurological:     General: No focal deficit present.     Mental Status: He is alert and oriented to person, place, and time. Mental status is at baseline.  Psychiatric:        Mood and Affect: Mood normal.        Behavior: Behavior normal.        Thought  Content: Thought content normal.     ED Results / Procedures / Treatments   Labs (all labs ordered are listed, but only abnormal results are displayed) Labs Reviewed  COMPREHENSIVE METABOLIC PANEL - Abnormal; Notable for the following components:      Result Value   Glucose, Bld 112 (*)    All other components within normal limits  CBC - Abnormal; Notable for the following components:   WBC 12.1 (*)    RBC 3.94 (*)    Hemoglobin 11.7 (*)    HCT 35.8 (*)    All other components within normal limits  CBC - Abnormal; Notable for the following components:   WBC 12.2 (*)    RBC 3.50 (*)    Hemoglobin 10.3 (*)    HCT 32.1 (*)    All other components within normal limits  POC OCCULT BLOOD, ED - Abnormal; Notable for the following components:   Fecal Occult Bld POSITIVE (*)    All other components within normal limits  SARS CORONAVIRUS 2 BY RT PCR (HOSPITAL ORDER, PERFORMED IN [facility])  POC OCCULT BLOOD, ED  TYPE AND SCREEN  ABO/RH    EKG None  Radiology No results found.  Procedures Procedures (including critical care time)  Medications Ordered in ED Medications - No data to display  ED Course  I have reviewed the triage vital signs and the nursing notes.  Pertinent labs & imaging results that were available during my care of the patient were reviewed by me and considered in my medical decision making (see chart for details).    MDM Rules/Calculators/A&P                          Patient is a 69 year old male presenting today with lower GI bleeding.  Symptoms have started since Wednesday but became significantly worse in the last 24 hours.  Unfortunately patient has been waiting for the last 9 hours and while in the waiting room has slowly started feeling worse.  Patient has become more tachycardic with some drop in blood pressure is now 105/76.  With standing patient becomes lightheaded and tachycardic.  He also feels short of breath which resolves with  lying down.  He does have dark red blood per rectum.  No abdominal pain.  Does have a history of diverticulosis and internal hemorrhoids and concern for bleed in 1 of these locations.  He does take 81 mg aspirin but no other anticoagulation.  Initial hemoglobin at 1 PM today showed a hemoglobin of 11.7 with mild leukocytosis of 12 and normal platelet count.  Patient was typed and screened and CMP without acute findings.  Hemoccult is  positive.  Will recheck hemoglobin given his prolonged wait and recurrent bowel movements to ensure patient does not need transfusion.  Will admit for further evaluation and consult [facility].  10:14 PM Spoke with Dr. Matthias Hughs who recommended ordering a tagged red blood cell scan due to ongoing active bleeding in the setting or diffuse diverticulitis.  Pt repeat Hb is 10 from 11...8 hours ago.  Will give bolus of fluid due to tachycardia but blood pressure remains >100 systolic.  Will admit for further care.  EKG with LBBB ...unknown if this is old, but no active chest pain.  MDM Number of Diagnoses or Management Options   Amount and/or Complexity of Data Reviewed Clinical lab tests: ordered and reviewed Tests in the radiology section of CPT: ordered and reviewed Tests in the medicine section of CPT: ordered and reviewed Decide to obtain previous medical records or to obtain history from someone other than the patient: yes Obtain history from someone other than the patient: no Review and summarize past medical records: yes Discuss the patient with other providers: yes Independent visualization of images, tracings, or specimens: yes  Risk of Complications, Morbidity, and/or Mortality Presenting problems: high Diagnostic procedures: moderate Management options: low  Patient Progress Patient progress: stable  CRITICAL CARE Performed by: Keegen Heffern Total critical care time: 30 minutes Critical care time was exclusive of separately billable procedures and  treating other patients. Critical care was necessary to treat or prevent imminent or life-threatening deterioration. Critical care was time spent personally by me on the following activities: development of treatment plan with patient and/or surrogate as well as nursing, discussions with consultants, evaluation of patient's response to treatment, examination of patient, obtaining history from patient or surrogate, ordering and performing treatments and interventions, ordering and review of laboratory studies, ordering and review of radiographic studies, pulse oximetry and re-evaluation of patient's condition.  Final Clinical Impression(s) / ED Diagnoses Final diagnoses:  Acute lower GI bleeding  Symptomatic anemia    Rx / DC Orders ED Discharge Orders    None       Gwyneth Sprout, MD 11/30/19 2218

## 2019-11-30 NOTE — ED Notes (Signed)
Patient reports having another episode of bloody stool while in the lobby

## 2019-11-30 NOTE — ED Notes (Addendum)
Pts wife came in and complained that pt had "changed color while sitting out here". Wife was assured that we are aware of her husbands status and have been checking on him regularly. No color change noticeable at this time.

## 2019-12-01 LAB — HEMOGLOBIN AND HEMATOCRIT, BLOOD
HCT: 29.6 % — ABNORMAL LOW (ref 39.0–52.0)
HCT: 26 % — ABNORMAL LOW (ref 39.0–52.0)
Hemoglobin: 9.9 g/dL — ABNORMAL LOW (ref 13.0–17.0)
Hemoglobin: 8.8 g/dL — ABNORMAL LOW (ref 13.0–17.0)

## 2019-12-01 LAB — CBC
HCT: 30 % — ABNORMAL LOW (ref 39.0–52.0)
Hemoglobin: 9.9 g/dL — ABNORMAL LOW (ref 13.0–17.0)
MCH: 28.8 pg (ref 26.0–34.0)
MCHC: 33 g/dL (ref 30.0–36.0)
MCV: 87.2 fL (ref 80.0–100.0)
Platelets: 201 10*3/uL (ref 150–400)
RBC: 3.44 MIL/uL — ABNORMAL LOW (ref 4.22–5.81)
RDW: 14 % (ref 11.5–15.5)
WBC: 8.6 10*3/uL (ref 4.0–10.5)
nRBC: 0 % (ref 0.0–0.2)

## 2019-12-01 LAB — HIV ANTIBODY (ROUTINE TESTING W REFLEX): HIV Screen 4th Generation wRfx: NONREACTIVE

## 2019-12-01 MED ORDER — ONDANSETRON HCL 4 MG PO TABS
4.0000 mg | ORAL_TABLET | Freq: Once | ORAL | Status: AC
  Administered 2019-12-01: 4 mg via ORAL
  Filled 2019-12-01: qty 1

## 2019-12-01 NOTE — Progress Notes (Signed)
PROGRESS NOTE    Juan Moore  NOM:[id number] DOB: [birth year] DOA: 11/30/2019 PCP: Johny Blamer, MD  Outpatient Specialists:   Brief Narrative:  Patient is a 69 year old Caucasian male with past medical history significant for pancolonic diverticulosis, hyperlipidemia and hypertension. Patient was admitted with bright red blood per rectum. 5 to 6 g drop in hemoglobin noted on presentation. Patient has intermittent dizziness. We will continue to monitor H/H and transfuse as needed. GI team is directing care. Concerns for diverticular bleed. Nuclear scan ordered by the GI.   Assessment & Plan:   Principal Problem:   Acute lower GI bleeding Active Problems:   Acute blood loss anemia   Leukocytosis   Hypertension   Left bundle branch block  Acute symptomatic blood loss anemia secondary to acute lower GI bleed/painless hematochezia: -History of pancolonic diverticulosis as per colonoscopy done June 2021 per GI note.   -Prior to admission, patient was on aspirin 81 Mg p.o. once daily.  -Patient presented with couple to several days of bright red blood per rectum.  -As per GI note, last hemoglobin prior to admission was 15.1 g/dL.  -Hemoglobin was 11.7 g/dL on presentation.  -Hemoglobin is down to 9.9 g/dL, but stable. -GI is directing care. -Nuclear scan. -Further management will depend on hospital course.  Mild leukocytosis: -Likely reactive. -Leukocytosis has resolved. -WBC today is 8.6   Hypertension -Low normal range. Continue to hold antihypertensives. s time  Left bundle branch block on EKG:  -No prior EKG for comparison.   -No chest pain no flash pulmonary edema reported.  DVT prophylaxis: SCD Code Status: Full code Family Communication: Wife Disposition Plan: Home eventually   Consultants:   GI  Procedures:   None for now  Antimicrobials:   None   Subjective: Intermittent dizziness  Objective: Vitals:   12/01/19 0541 12/01/19 0655  12/01/19 0725 12/01/19 0752  BP: 124/75 108/74 107/74 113/76  Pulse: 94 91 90 88  Resp: 18 18 18 18   Temp: 98.6 F (37 C) 98.4 F (36.9 C) 97.8 F (36.6 C) 98.2 F (36.8 C)  TempSrc: Oral Oral Oral Other (Comment)  SpO2: 94% 91% 97% 98%  Weight: 97.4 kg     Height:        Intake/Output Summary (Last 24 hours) at 12/01/2019 1048 Last data filed at 12/01/2019 0655 Gross per 24 hour  Intake 997.5 ml  Output --  Net 997.5 ml   Filed Weights   11/30/19 1304 12/01/19 0541  Weight: 93 kg 97.4 kg    Examination:  General exam: Appears calm and comfortable. Respiratory system: Clear to auscultation.  Cardiovascular system: S1 & S2 heard Gastrointestinal system: Abdomen is nondistended, soft and nontender. No organomegaly or masses felt. Normal bowel sounds heard. Central nervous system: Alert and oriented. No focal neurological deficits. Extremities: No leg edema.  Data Reviewed: I have personally reviewed following labs and imaging studies  CBC: Recent Labs  Lab 11/30/19 1316 11/30/19 2021 11/30/19 2243 12/01/19 0736  WBC 12.1* 12.2* 12.0* 8.6  HGB 11.7* 10.3* 9.4* 9.9*  HCT 35.8* 32.1* 29.4* 30.0*  MCV 90.9 91.7 91.6 87.2  PLT 280 263 238 201   Basic Metabolic Panel: Recent Labs  Lab 11/30/19 1316  NA 138  K 4.5  CL 101  CO2 25  GLUCOSE 112*  BUN 16  CREATININE 0.89  CALCIUM 9.6   GFR: Estimated Creatinine Clearance: 94.7 mL/min (by C-G formula based on SCr of 0.89 mg/dL). Liver Function Tests: Recent Labs  Lab  11/30/19 1316  AST 23  ALT 16  ALKPHOS 53  BILITOT 0.8  PROT 6.8  ALBUMIN 4.0   No results for input(s): LIPASE, AMYLASE in the last 168 hours. No results for input(s): AMMONIA in the last 168 hours. Coagulation Profile: Recent Labs  Lab 11/30/19 2218  INR 1.2   Cardiac Enzymes: No results for input(s): CKTOTAL, CKMB, CKMBINDEX, TROPONINI in the last 168 hours. BNP (last 3 results) No results for input(s): PROBNP in the last 8760  hours. HbA1C: No results for input(s): HGBA1C in the last 72 hours. CBG: No results for input(s): GLUCAP in the last 168 hours. Lipid Profile: No results for input(s): CHOL, HDL, LDLCALC, TRIG, CHOLHDL, LDLDIRECT in the last 72 hours. Thyroid Function Tests: No results for input(s): TSH, T4TOTAL, FREET4, T3FREE, THYROIDAB in the last 72 hours. Anemia Panel: No results for input(s): VITAMINB12, FOLATE, FERRITIN, TIBC, IRON, RETICCTPCT in the last 72 hours. Urine analysis: No results found for: COLORURINE, APPEARANCEUR, LABSPEC, PHURINE, GLUCOSEU, HGBUR, BILIRUBINUR, KETONESUR, PROTEINUR, UROBILINOGEN, NITRITE, LEUKOCYTESUR Sepsis Labs: @LABRCNTIP (procalcitonin:4,lacticidven:4)  ) Recent Results (from the past 240 hour(s))  SARS Coronavirus 2 by RT PCR (hospital order, performed in [facility] hospital lab) Nasopharyngeal Nasopharyngeal Swab     Status: None   Collection Time: 11/30/19  9:34 PM   Specimen: Nasopharyngeal Swab  Result Value Ref Range Status   SARS Coronavirus 2 NEGATIVE NEGATIVE Final    Comment: (NOTE) SARS-CoV-2 target nucleic acids are NOT DETECTED.  The SARS-CoV-2 RNA is generally detectable in upper and lower respiratory specimens during the acute phase of infection. The lowest concentration of SARS-CoV-2 viral copies this assay can detect is 250 copies / mL. A negative result does not preclude SARS-CoV-2 infection and should not be used as the sole basis for treatment or other patient management decisions.  A negative result may occur with improper specimen collection / handling, submission of specimen other than nasopharyngeal swab, presence of viral mutation(s) within the areas targeted by this assay, and inadequate number of viral copies (<250 copies / mL). A negative result must be combined with clinical observations, patient history, and epidemiological information.  Fact Sheet for Patients:   12/02/19  Fact Sheet for  Healthcare Providers: BoilerBrush.com.cy  This test is not yet approved or  cleared by the https://pope.com/ FDA and has been authorized for detection and/or diagnosis of SARS-CoV-2 by FDA under an Emergency Use Authorization (EUA).  This EUA will remain in effect (meaning this test can be used) for the duration of the COVID-19 declaration under Section 564(b)(1) of the Act, 21 U.S.C. section 360bbb-3(b)(1), unless the authorization is terminated or revoked sooner.  Performed at [facility], [address], [place], [place]          Radiology Studies: No results found.      Scheduled Meds: . sodium chloride   Intravenous Once  . pantoprazole (PROTONIX) IV  40 mg Intravenous Q24H   Continuous Infusions: . lactated ringers 150 mL/hr at 12/01/19 0931     LOS: 1 day    Time spent: 35 minutes    12/03/19, MD  [facility] Pager #: [phone] 7PM-7AM contact night coverage as above

## 2019-12-01 NOTE — Progress Notes (Signed)
Pt getting Q6 H&H. Latest HGB 8.8 which is down from 9.9 on previous result. Pt has ambulated to bathroom without any further bleeding from rectum. Dr Rachael Darby notified of results. No new orders at this time-will continue to monitor. Dierdre Highman, RN

## 2019-12-01 NOTE — Progress Notes (Signed)
Nuclear medicine bleeding scan apparently not done.  However, decline in hemoglobin has leveled off and is actually slightly improved this morning at 9.9.  Pt has not had any passage of blood for approximately the past 19 hrs.  EXAM: NAD whatsoever. VSS.  Good spirits, alert, wife at bedside.  Skin warm, radial pulse full. Chest clear, heart nl, abd NT.  IMPR:    1. Quiescent GIB, presumed diverticular  2. Post-hemorrhagic anemia, moderate, stable   PLAN:  1. D/c previous plan for bleeding scan in view of spontaneous cessation of bleeding 2. Allow clr liq now, and soft diet later today unless bleeding restarts 3. Decrease IVF in view of po intake and absence of bleeding  Florencia Reasons, M.D. Pager [phone] If no answer or after 5 PM call [phone]

## 2019-12-01 NOTE — Progress Notes (Signed)
Patient went to bathroom to void as he was sitting, no bowel movement but patient reports large amount of bright red blood that "fell out." Pateint had flushed before I got in there. I placed a hat in toilet and informed patient to call prior to going to bathroom so we can assist if this happens in the future. I let Dr. Matthias Hughs be made aware of situation. He stated to call back if this got very frequent like every hour and we also had primary team to assist. Patient kept on clears to see how this progressed. Patient and wife made aware of plan

## 2019-12-02 DIAGNOSIS — I447 Left bundle-branch block, unspecified: Secondary | ICD-10-CM

## 2019-12-02 DIAGNOSIS — I10 Essential (primary) hypertension: Secondary | ICD-10-CM

## 2019-12-02 DIAGNOSIS — D62 Acute posthemorrhagic anemia: Secondary | ICD-10-CM

## 2019-12-02 DIAGNOSIS — D72829 Elevated white blood cell count, unspecified: Secondary | ICD-10-CM

## 2019-12-02 LAB — TYPE AND SCREEN
ABO/RH(D): B POS
Antibody Screen: NEGATIVE
Unit division: 0
Unit division: 0

## 2019-12-02 LAB — BPAM RBC
Blood Product Expiration Date: 202109152359
Blood Product Expiration Date: 202109152359
ISSUE DATE / TIME: 202108220016
ISSUE DATE / TIME: 202108220338
Unit Type and Rh: 7300
Unit Type and Rh: 7300

## 2019-12-02 LAB — HEMOGLOBIN AND HEMATOCRIT, BLOOD
HCT: 25.9 % — ABNORMAL LOW (ref 39.0–52.0)
HCT: 27.4 % — ABNORMAL LOW (ref 39.0–52.0)
HCT: 28.6 % — ABNORMAL LOW (ref 39.0–52.0)
Hemoglobin: 8.5 g/dL — ABNORMAL LOW (ref 13.0–17.0)
Hemoglobin: 9 g/dL — ABNORMAL LOW (ref 13.0–17.0)
Hemoglobin: 9.5 g/dL — ABNORMAL LOW (ref 13.0–17.0)

## 2019-12-02 MED ORDER — POLYETHYLENE GLYCOL 3350 17 G PO PACK
17.0000 g | PACK | Freq: Two times a day (BID) | ORAL | Status: DC
  Administered 2019-12-02 – 2019-12-05 (×7): 17 g via ORAL
  Filled 2019-12-02 (×7): qty 1

## 2019-12-02 MED ORDER — LACTATED RINGERS IV SOLN: INTRAVENOUS | Status: DC

## 2019-12-02 NOTE — Progress Notes (Signed)
PROGRESS NOTE    Juan Moore  WSF:[id number]  DOB: 06-30-1950  PCP: Johny Blamer, MD Admit date:11/30/2019 Chief compliant: bright red blood per rectum Hospital course: 69 year old Caucasian male with past medical history significant for pancolonic diverticulosis, hyperlipidemia and hypertension. Patient was admitted with bright red blood per rectum. 5 to 6 g drop in hemoglobin noted on presentation. Patient has intermittent dizziness. We will continue to monitor H/H and transfuse as needed. GI team is directing care. Concerns for diverticular bleed. Nuclear scan ordered by the GI.   Subjective:  Contacted by primary nurse earlier this morning regarding diet as patient was anxious to eat.  Per GI note yesterday advance diet as tolerated.  Soft diet ordered but on arrival to his room he reported 1 episode of GI bleeding yesterday at 4 PM and stated GI APP who rounded on him earlier recommended to stay n.p.o. until evaluated by gastroenterologist.  No complaints of abdominal pain.  Wife at bedside.  SBP borderline  Objective: Vitals:   12/01/19 1938 12/02/19 0002 12/02/19 0519 12/02/19 0832  BP: 103/73 126/69 (!) 98/58 111/73  Pulse: 96 99 88 85  Resp: 18 18 18 18   Temp: 98.2 F (36.8 C) 98.1 F (36.7 C) 98 F (36.7 C) 98.1 F (36.7 C)  TempSrc: Oral Oral Oral Oral  SpO2: 98% 99% 98% 99%  Weight:   90.2 kg   Height:       No intake or output data in the 24 hours ending 12/02/19 1121 Filed Weights   11/30/19 1304 12/01/19 0541 12/02/19 0519  Weight: 93 kg 97.4 kg 90.2 kg    Physical Examination:  General: Moderately built, no acute distress noted Head ENT: Atraumatic normocephalic, PERRLA, neck supple Heart: S1-S2 heard, regular rate and rhythm, no murmurs.  No leg edema noted Lungs: Equal air entry bilaterally, no rhonchi or rales on exam, no accessory muscle use Abdomen: Bowel sounds heard, soft, nontender, nondistended. No organomegaly.  No CVA  tenderness Extremities: No pedal edema.  No cyanosis or clubbing. Neurological: Awake alert oriented x3, no focal weakness or numbness, strength and sensations to crude touch intact Skin: No wounds or rashes.  Data Reviewed: I have personally reviewed following labs and imaging studies  CBC: Recent Labs  Lab 11/30/19 1316 11/30/19 1316 11/30/19 2021 11/30/19 2021 11/30/19 2243 11/30/19 2243 12/01/19 0736 12/01/19 1625 12/01/19 2210 12/02/19 0432 12/02/19 1010  WBC 12.1*  --  12.2*  --  12.0*  --  8.6  --   --   --   --   HGB 11.7*   < > 10.3*   < > 9.4*   < > 9.9* 9.9* 8.8* 8.5* 9.0*  HCT 35.8*   < > 32.1*   < > 29.4*   < > 30.0* 29.6* 26.0* 25.9* 27.4*  MCV 90.9  --  91.7  --  91.6  --  87.2  --   --   --   --   PLT 280  --  263  --  238  --  201  --   --   --   --    < > = values in this interval not displayed.   Basic Metabolic Panel: Recent Labs  Lab 11/30/19 1316  NA 138  K 4.5  CL 101  CO2 25  GLUCOSE 112*  BUN 16  CREATININE 0.89  CALCIUM 9.6   GFR: Estimated Creatinine Clearance: 86 mL/min (by C-G formula based on SCr of 0.89 mg/dL). Liver  Function Tests: Recent Labs  Lab 11/30/19 1316  AST 23  ALT 16  ALKPHOS 53  BILITOT 0.8  PROT 6.8  ALBUMIN 4.0   No results for input(s): LIPASE, AMYLASE in the last 168 hours. No results for input(s): AMMONIA in the last 168 hours. Coagulation Profile: Recent Labs  Lab 11/30/19 2218  INR 1.2   Cardiac Enzymes: No results for input(s): CKTOTAL, CKMB, CKMBINDEX, TROPONINI in the last 168 hours. BNP (last 3 results) No results for input(s): PROBNP in the last 8760 hours. HbA1C: No results for input(s): HGBA1C in the last 72 hours. CBG: No results for input(s): GLUCAP in the last 168 hours. Lipid Profile: No results for input(s): CHOL, HDL, LDLCALC, TRIG, CHOLHDL, LDLDIRECT in the last 72 hours. Thyroid Function Tests: No results for input(s): TSH, T4TOTAL, FREET4, T3FREE, THYROIDAB in the last 72  hours. Anemia Panel: No results for input(s): VITAMINB12, FOLATE, FERRITIN, TIBC, IRON, RETICCTPCT in the last 72 hours. Sepsis Labs: No results for input(s): PROCALCITON, LATICACIDVEN in the last 168 hours.  Recent Results (from the past 240 hour(s))  SARS Coronavirus 2 by RT PCR (hospital order, performed in [facility] hospital lab) Nasopharyngeal Nasopharyngeal Swab     Status: None   Collection Time: 11/30/19  9:34 PM   Specimen: Nasopharyngeal Swab  Result Value Ref Range Status   SARS Coronavirus 2 NEGATIVE NEGATIVE Final    Comment: (NOTE) SARS-CoV-2 target nucleic acids are NOT DETECTED.  The SARS-CoV-2 RNA is generally detectable in upper and lower respiratory specimens during the acute phase of infection. The lowest concentration of SARS-CoV-2 viral copies this assay can detect is 250 copies / mL. A negative result does not preclude SARS-CoV-2 infection and should not be used as the sole basis for treatment or other patient management decisions.  A negative result may occur with improper specimen collection / handling, submission of specimen other than nasopharyngeal swab, presence of viral mutation(s) within the areas targeted by this assay, and inadequate number of viral copies (<250 copies / mL). A negative result must be combined with clinical observations, patient history, and epidemiological information.  Fact Sheet for Patients:   BoilerBrush.com.cy  Fact Sheet for Healthcare Providers: https://pope.com/  This test is not yet approved or  cleared by the [place] FDA and has been authorized for detection and/or diagnosis of SARS-CoV-2 by FDA under an Emergency Use Authorization (EUA).  This EUA will remain in effect (meaning this test can be used) for the duration of the COVID-19 declaration under Section 564(b)(1) of the Act, 21 U.S.C. section 360bbb-3(b)(1), unless the authorization is terminated or revoked  sooner.  Performed at [facility], [address] [place], [place] [address]       Radiology Studies: No results found.    Scheduled Meds: . polyethylene glycol  17 g Oral BID   Continuous Infusions: . lactated ringers        Assessment/Plan:  Lower GI bleed, probably diverticular: History of pancolonic diverticulosis as per colonoscopy done June 2021 per GI note. Prior to admission, patient was on aspirin 81 Mg p.o. once daily. Patient presented with couple to several days of bright red blood per rectum. GI is directing care.Nuclear scan initially planned but later discontinued in view of spontaneous cessation of bleeding. GI strated patient on CLD yesterday, recommended advancing to soft diet unless bleeding restarts. Discontinue IV fluids and advance diet , d/c IV PPI as GIB felt to be lower.Hgb overnight 9.4->9.9->8.8->8.5-> 9.0, reports 1 episode of  BRBPR yesterday around 4 PM. Will continue IVF/clear liquid diet, follow GI recommendations.  Consider bleeding scan if significant bleeding recurs.  Acutesymptomaticblood loss anemia: secondary to above--As per GI note, last hemoglobin prior to admission was 15.1 g/dL. Hemoglobin was 11.7 g/dL on presentation-now down to 8.5-9.0 g/dL, but stable.Further management will depend on hospital course.  Continue IV fluids and clear liquid diet for now.  Transfuse if drops less than 7.  Mild leukocytosis:Likely reactive.Leukocytosis has resolved.WBC now 8.6  Hypertension-Low normal range BP here in the setting of GIB. Home antihypertensives held at this time.  Continue IV fluids.  Left bundle branch block on EKG:-No prior EKG for comparison.  -No chest pain no flash pulmonary edema reported.   DVT prophylaxis: SCD given GIB Code Status: Full code Family / Patient Communication: Discussed with patient and significant other at bedside.  Discussed with GI Disposition Plan:   Status is: Inpatient  Remains inpatient  appropriate because:IV treatments appropriate due to intensity of illness or inability to take PO   Dispo: The patient is from: Home              Anticipated d/c is to: Home              Anticipated d/c date is: 1 day              Patient currently is not medically stable to d/c.      Will downgrade to telemetry bed. Time spent: 35 minutes     >50% time spent in discussions with care team and coordination of care.    Alessandra Bevels, MD [facility] Pager in [place]  If 7PM-7AM, please contact night-coverage [email or web address] 12/02/2019, 11:21 AM

## 2019-12-02 NOTE — Progress Notes (Signed)
[facility] Gastroenterology Progress Note  Juan Moore 69 y.o. [birth year]  CC: Lower GI bleeding   Subjective: Patient passed a large amount of frank red blood yesterday around 4:30 PM.  Since that time, he has not passed any more blood or had any bowel movements.  Denies any abdominal pain.  Denies nausea, vomiting.  ROS : Review of Systems  Respiratory: Negative for cough and shortness of breath.   Cardiovascular: Negative for chest pain and palpitations.  Gastrointestinal: Positive for blood in stool. Negative for abdominal pain, constipation, diarrhea, heartburn, melena, nausea and vomiting.    Objective: Vital signs in last 24 hours: Vitals:   12/02/19 0519 12/02/19 0832  BP: (!) 98/58 111/73  Pulse: 88 85  Resp: 18 18  Temp: 98 F (36.7 C) 98.1 F (36.7 C)  SpO2: 98% 99%    Physical Exam:  General:  Alert, oriented, cooperative, no acute distress  Head:  Normocephalic, without obvious abnormality, atraumatic  Eyes:  Mild conjunctival pallor, EOMs intact  Lungs:   Clear to auscultation bilaterally, respirations unlabored  Heart:  Regular rate and rhythm, S1, S2 normal  Abdomen:   Soft, non-tender, non-distended, bowel sounds active all four quadrants,  no guarding or peritoneal signs  Extremities: Extremities normal, atraumatic, no  edema  Pulses: 2+ and symmetric    Lab Results: Recent Labs    11/30/19 1316  NA 138  K 4.5  CL 101  CO2 25  GLUCOSE 112*  BUN 16  CREATININE 0.89  CALCIUM 9.6   Recent Labs    11/30/19 1316  AST 23  ALT 16  ALKPHOS 53  BILITOT 0.8  PROT 6.8  ALBUMIN 4.0   Recent Labs    11/30/19 2243 11/30/19 2243 12/01/19 0736 12/01/19 1625 12/01/19 2210 12/02/19 0432  WBC 12.0*  --  8.6  --   --   --   HGB 9.4*   < > 9.9*   < > 8.8* 8.5*  HCT 29.4*   < > 30.0*   < > 26.0* 25.9*  MCV 91.6  --  87.2  --   --   --   PLT 238  --  201  --   --   --    < > = values in this interval not displayed.   Recent Labs    11/30/19 2218   LABPROT 14.6  INR 1.2    Assessment: Lower GI bleeding, currently quiescent, most likely diverticular -One episode of rectal bleeding at 4:30 PM yesterday, none since that time -Hgb dropped from 9.9 to 8.5, though no further episodes of bleeding  Plan:  Hold off on GI bleeding scan for now.  Continue monitor H&H with transfusions need to maintain hemoglobin greater than 7.  MiraLAX twice daily to help assess whether or not patient is actively bleeding.  [facility] will follow.  Edrick Kins PA-C 12/02/2019, 10:28 AM  Contact #  [phone]

## 2019-12-03 LAB — CBC WITH DIFFERENTIAL/PLATELET
Abs Immature Granulocytes: 0.03 10*3/uL (ref 0.00–0.07)
Basophils Absolute: 0 10*3/uL (ref 0.0–0.1)
Basophils Relative: 0 %
Eosinophils Absolute: 0.3 10*3/uL (ref 0.0–0.5)
Eosinophils Relative: 4 %
HCT: 23.6 % — ABNORMAL LOW (ref 39.0–52.0)
Hemoglobin: 7.8 g/dL — ABNORMAL LOW (ref 13.0–17.0)
Immature Granulocytes: 0 %
Lymphocytes Relative: 17 %
Lymphs Abs: 1.3 10*3/uL (ref 0.7–4.0)
MCH: 29.5 pg (ref 26.0–34.0)
MCHC: 33.1 g/dL (ref 30.0–36.0)
MCV: 89.4 fL (ref 80.0–100.0)
Monocytes Absolute: 0.7 10*3/uL (ref 0.1–1.0)
Monocytes Relative: 8 %
Neutro Abs: 5.7 10*3/uL (ref 1.7–7.7)
Neutrophils Relative %: 71 %
Platelets: 196 10*3/uL (ref 150–400)
RBC: 2.64 MIL/uL — ABNORMAL LOW (ref 4.22–5.81)
RDW: 14.1 % (ref 11.5–15.5)
WBC: 8 10*3/uL (ref 4.0–10.5)
nRBC: 0 % (ref 0.0–0.2)

## 2019-12-03 LAB — CBC
HCT: 22.7 % — ABNORMAL LOW (ref 39.0–52.0)
HCT: 25.1 % — ABNORMAL LOW (ref 39.0–52.0)
Hemoglobin: 7.5 g/dL — ABNORMAL LOW (ref 13.0–17.0)
Hemoglobin: 8.2 g/dL — ABNORMAL LOW (ref 13.0–17.0)
MCH: 29.6 pg (ref 26.0–34.0)
MCH: 29.3 pg (ref 26.0–34.0)
MCHC: 33 g/dL (ref 30.0–36.0)
MCHC: 32.7 g/dL (ref 30.0–36.0)
MCV: 89.7 fL (ref 80.0–100.0)
MCV: 89.6 fL (ref 80.0–100.0)
Platelets: 191 10*3/uL (ref 150–400)
Platelets: 219 10*3/uL (ref 150–400)
RBC: 2.53 MIL/uL — ABNORMAL LOW (ref 4.22–5.81)
RBC: 2.8 MIL/uL — ABNORMAL LOW (ref 4.22–5.81)
RDW: 14 % (ref 11.5–15.5)
RDW: 14.1 % (ref 11.5–15.5)
WBC: 7.3 10*3/uL (ref 4.0–10.5)
WBC: 8.5 10*3/uL (ref 4.0–10.5)
nRBC: 0 % (ref 0.0–0.2)
nRBC: 0 % (ref 0.0–0.2)

## 2019-12-03 LAB — HEMOGLOBIN AND HEMATOCRIT, BLOOD
HCT: 22.7 % — ABNORMAL LOW (ref 39.0–52.0)
Hemoglobin: 7.5 g/dL — ABNORMAL LOW (ref 13.0–17.0)

## 2019-12-03 NOTE — Progress Notes (Signed)
Patient had one bowel movement last night, it was about 15cc, dark red color. Patient denied dizziness, cp or sob. BP 106/69, HR 91, 99% RA

## 2019-12-03 NOTE — Progress Notes (Addendum)
PROGRESS NOTE    BYRL LATIN  BHA:[id number]  DOB: 14-Dec-1950  PCP: Johny Blamer, MD Admit date:11/30/2019 Chief compliant: bright red blood per rectum Hospital course: 69 year old Caucasian male with past medical history significant for pancolonic diverticulosis, hyperlipidemia and hypertension. Patient was admitted with bright red blood per rectum. 5 to 6 g drop in hemoglobin noted on presentation. Patient has intermittent dizziness. We will continue to monitor H/H and transfuse as needed. GI team is directing care. Concerns for diverticular bleed. Nuclear scan ordered by the GI.   Subjective:  Patient reports an episode of dark blood per rectum last night about 15 cc.  Hemoglobin however dropped by 2 points.  He denies feeling dizzy or weak or chest pain.  Wife at bedside.  Seen by GI before my arrival.  Objective: Vitals:   12/02/19 1645 12/02/19 1938 12/03/19 0440 12/03/19 0906  BP: 125/87 118/82 106/69   Pulse: 98 95 91   Resp: 18 18    Temp: 98.6 F (37 C) 99 F (37.2 C) 98.9 F (37.2 C) 98 F (36.7 C)  TempSrc: Oral Oral Oral Oral  SpO2: 98% 98% 99%   Weight:   90.1 kg   Height:        Intake/Output Summary (Last 24 hours) at 12/03/2019 1310 Last data filed at 12/02/2019 2300 Gross per 24 hour  Intake 280 ml  Output 115 ml  Net 165 ml   Filed Weights   12/01/19 0541 12/02/19 0519 12/03/19 0440  Weight: 97.4 kg 90.2 kg 90.1 kg    Physical Examination:  General: Moderately built, no acute distress noted Head ENT: Atraumatic normocephalic, PERRLA, neck supple Heart: S1-S2 heard, regular rate and rhythm, no murmurs.  No leg edema noted Lungs: Equal air entry bilaterally, no rhonchi or rales on exam, no accessory muscle use Abdomen: Bowel sounds heard, soft, nontender, nondistended. No organomegaly.  No CVA tenderness Extremities: No pedal edema.  No cyanosis or clubbing. Neurological: Awake alert oriented x3, no focal weakness or numbness, strength and  sensations to crude touch intact Skin: No wounds or rashes.  Data Reviewed: I have personally reviewed following labs and imaging studies  CBC: Recent Labs  Lab 11/30/19 1316 11/30/19 1316 11/30/19 2021 11/30/19 2021 11/30/19 2243 11/30/19 2243 12/01/19 0736 12/01/19 1625 12/02/19 0432 12/02/19 1010 12/02/19 1809 12/03/19 0412 12/03/19 0929  WBC 12.1*  --  12.2*  --  12.0*  --  8.6  --   --   --   --   --  8.0  NEUTROABS  --   --   --   --   --   --   --   --   --   --   --   --  5.7  HGB 11.7*   < > 10.3*   < > 9.4*   < > 9.9*   < > 8.5* 9.0* 9.5* 7.5* 7.8*  HCT 35.8*   < > 32.1*   < > 29.4*   < > 30.0*   < > 25.9* 27.4* 28.6* 22.7* 23.6*  MCV 90.9  --  91.7  --  91.6  --  87.2  --   --   --   --   --  89.4  PLT 280  --  263  --  238  --  201  --   --   --   --   --  196   < > = values in this interval not displayed.  Basic Metabolic Panel: Recent Labs  Lab 11/30/19 1316  NA 138  K 4.5  CL 101  CO2 25  GLUCOSE 112*  BUN 16  CREATININE 0.89  CALCIUM 9.6   GFR: Estimated Creatinine Clearance: 86 mL/min (by C-G formula based on SCr of 0.89 mg/dL). Liver Function Tests: Recent Labs  Lab 11/30/19 1316  AST 23  ALT 16  ALKPHOS 53  BILITOT 0.8  PROT 6.8  ALBUMIN 4.0   No results for input(s): LIPASE, AMYLASE in the last 168 hours. No results for input(s): AMMONIA in the last 168 hours. Coagulation Profile: Recent Labs  Lab 11/30/19 2218  INR 1.2   Cardiac Enzymes: No results for input(s): CKTOTAL, CKMB, CKMBINDEX, TROPONINI in the last 168 hours. BNP (last 3 results) No results for input(s): PROBNP in the last 8760 hours. HbA1C: No results for input(s): HGBA1C in the last 72 hours. CBG: No results for input(s): GLUCAP in the last 168 hours. Lipid Profile: No results for input(s): CHOL, HDL, LDLCALC, TRIG, CHOLHDL, LDLDIRECT in the last 72 hours. Thyroid Function Tests: No results for input(s): TSH, T4TOTAL, FREET4, T3FREE, THYROIDAB in the last 72  hours. Anemia Panel: No results for input(s): VITAMINB12, FOLATE, FERRITIN, TIBC, IRON, RETICCTPCT in the last 72 hours. Sepsis Labs: No results for input(s): PROCALCITON, LATICACIDVEN in the last 168 hours.  Recent Results (from the past 240 hour(s))  SARS Coronavirus 2 by RT PCR (hospital order, performed in [facility] hospital lab) Nasopharyngeal Nasopharyngeal Swab     Status: None   Collection Time: 11/30/19  9:34 PM   Specimen: Nasopharyngeal Swab  Result Value Ref Range Status   SARS Coronavirus 2 NEGATIVE NEGATIVE Final    Comment: (NOTE) SARS-CoV-2 target nucleic acids are NOT DETECTED.  The SARS-CoV-2 RNA is generally detectable in upper and lower respiratory specimens during the acute phase of infection. The lowest concentration of SARS-CoV-2 viral copies this assay can detect is 250 copies / mL. A negative result does not preclude SARS-CoV-2 infection and should not be used as the sole basis for treatment or other patient management decisions.  A negative result may occur with improper specimen collection / handling, submission of specimen other than nasopharyngeal swab, presence of viral mutation(s) within the areas targeted by this assay, and inadequate number of viral copies (<250 copies / mL). A negative result must be combined with clinical observations, patient history, and epidemiological information.  Fact Sheet for Patients:   BoilerBrush.com.cy  Fact Sheet for Healthcare Providers: https://pope.com/  This test is not yet approved or  cleared by the [place] FDA and has been authorized for detection and/or diagnosis of SARS-CoV-2 by FDA under an Emergency Use Authorization (EUA).  This EUA will remain in effect (meaning this test can be used) for the duration of the COVID-19 declaration under Section 564(b)(1) of the Act, 21 U.S.C. section 360bbb-3(b)(1), unless the authorization is terminated or revoked  sooner.  Performed at [facility], [address], [place], [place] [address]       Radiology Studies: No results found.    Scheduled Meds: . polyethylene glycol  17 g Oral BID   Continuous Infusions: . lactated ringers 75 mL/hr at 12/03/19 0130      Assessment/Plan:  Lower GI bleed, probably diverticular: History of pancolonic diverticulosis as per colonoscopy done June 2021 per GI note. Prior to admission, patient was on aspirin 81 Mg p.o. once daily. Patient presented with couple to several days of bright red blood per rectum.  GI is directing care. GI, yesterday, recommended to keep patient on clear liquid diet until reevaluation this morning.  Hgb pattern has been downtrending overall-9.4->9.9->8.8->8.5-> 9.0->7.5->7.8 today.  Per GI,  continue clear liquid diet today as well, hold off on bleeding scan unless develops brisk GI bleed in which case CT angiogram can be considered.  Monitor with CBC every 12 per Dr. Matthias Hughs.  GI may consider colonoscopy based on clinical picture tomorrow.    Acutesymptomaticblood loss anemia: secondary to above--As per GI note, last hemoglobin prior to admission was 15.1 g/dL. Hemoglobin was 11.7 g/dL on presentation-now down to 7.5-7.8 g/dL.Will reduce IV fluids to avoid dilutional effect as patient tolerating clear liquid diet and normotensive today.  Follow GI recommendations.  Transfuse if hemoglobin less than 7 or develops brisk bleeding-patient and wife agreeable.  Mild leukocytosis:Likely reactive.Leukocytosis has resolved.  Hypertension-Low normal range BP here in the setting of GIB. Home antihypertensives held at this time.  BP improved today-continue to monitor with reduced rate of IV fluids.  Left bundle branch block on EKG:-No prior EKG for comparison.  -No chest pain no flash pulmonary edema reported.   DVT prophylaxis: SCD given GIB Code Status: Full code Family / Patient Communication: Discussed with patient and  significant other at bedside.   Disposition Plan:   Status is: Inpatient  Remains inpatient appropriate because:IV treatments appropriate due to intensity of illness or inability to take PO   Dispo: The patient is from: Home              Anticipated d/c is to: Home              Anticipated d/c date is: 2 days              Patient currently is not medically stable to d/c.      Will downgrade to telemetry bed. Time spent: 35 minutes     >50% time spent in discussions with care team and coordination of care.    Alessandra Bevels, MD [facility] Pager in [place]  If 7PM-7AM, please contact night-coverage [email or web address] 12/03/2019, 1:10 PM

## 2019-12-03 NOTE — Progress Notes (Signed)
Patient was hypotensive last night and, this morning, has had a roughly 2 g drop in hemoglobin from baseline, verified by repeat CBC.  This morning, however, he feels fine.  Moreover, he only had 1 or 2 small bloody bowel movements through the night, so there is a disconnect between the apparent amount of bleeding and the amount of blood that he has expelled.  Current hemoglobin is 7.8, compared to 9.5 yesterday.  Exam: Patient sitting up in bedside chair, looking fine, joking around, no distress.  No pallor.  Skin warm and well perfused, radial pulses strong.  Chest is clear, heart normal without murmur or tachycardia.  Abdomen is nontender.  Impression: Recent, but probably not current, GI bleeding, most likely diverticular in origin.  Cannot exclude gradual ongoing bleed.  Colonoscopy 6 weeks ago was unrevealing except diverticulosis.  Recommendation:  1.  We will change from H&H to CBC for monitoring of hemoglobin level.  In my experience, CBC seem to give more accurate, consistent results  2.  Continue clear liquid diet and low-dose MiraLAX  3.  We discussed the option of a bleeding scan today.  I think there is only a slight chance it would identify active bleeding and/or localize her bleeding site.  Based on that, the patient and his wife, who is at the bedside, are agreeable with my recommendation that we hold off on that study.  However, if he were to develop brisk acute bleeding, I think a CT angiogram might be the best course of action.  4.  We will reassess the patient tomorrow.  Depending on his status, we might try prepping him for colonoscopy the following day, to see if we can more clearly determine whether or not there is a gradual ooze going on, and, if so, possibly localize what section of the colon the blood is originating from.  In my experience, however, colonoscopy is seldom therapeutically effective for diverticular bleeding and alternative causation such as colonic  ulceration, stercoral ulceration, or vascular ectasia all seem very unlikely, especially with a recently negative colonoscopy (apart from extensive diverticulosis).  Florencia Reasons, M.D. Pager [phone] If no answer or after 5 PM call [phone]  -

## 2019-12-04 LAB — CBC
HCT: 22.8 % — ABNORMAL LOW (ref 39.0–52.0)
HCT: 23.1 % — ABNORMAL LOW (ref 39.0–52.0)
Hemoglobin: 7.4 g/dL — ABNORMAL LOW (ref 13.0–17.0)
Hemoglobin: 7.4 g/dL — ABNORMAL LOW (ref 13.0–17.0)
MCH: 29.5 pg (ref 26.0–34.0)
MCH: 29 pg (ref 26.0–34.0)
MCHC: 32.5 g/dL (ref 30.0–36.0)
MCHC: 32 g/dL (ref 30.0–36.0)
MCV: 90.8 fL (ref 80.0–100.0)
MCV: 90.6 fL (ref 80.0–100.0)
Platelets: 215 10*3/uL (ref 150–400)
Platelets: 219 10*3/uL (ref 150–400)
RBC: 2.51 MIL/uL — ABNORMAL LOW (ref 4.22–5.81)
RBC: 2.55 MIL/uL — ABNORMAL LOW (ref 4.22–5.81)
RDW: 14.3 % (ref 11.5–15.5)
RDW: 14.5 % (ref 11.5–15.5)
WBC: 6.1 10*3/uL (ref 4.0–10.5)
WBC: 6.8 10*3/uL (ref 4.0–10.5)
nRBC: 0 % (ref 0.0–0.2)
nRBC: 0 % (ref 0.0–0.2)

## 2019-12-04 NOTE — Progress Notes (Signed)
PROGRESS NOTE    Juan Moore  HTD:[id number] DOB: [birth year] DOA: 11/30/2019 PCP: Johny Blamer, MD     Brief Narrative:  Juan Moore is a 69 year old Caucasian male with past medical history significant for pancolonic diverticulosis, hyperlipidemia, and hypertension. Patient was admitted with bright red blood per rectum. Hgb 9.4 on admission with slow trend downward. GI consulted.   New events last 24 hours / Subjective: Patient denies passing any blood or stool overnight.  Has no complaints this morning.  Remains on clear liquid diet and tolerating well.  Assessment & Plan:   Principal Problem:   Acute lower GI bleeding Active Problems:   Acute blood loss anemia   Leukocytosis   Hypertension   Left bundle branch block   Lower GI bleed, suspected diverticular -Appreciate GI, advancing diet today and monitor H&H -Hold aspirin  Acute symptomatic blood loss anemia -Transfuse for hemoglobin less than 7  Essential hypertension -Antihypertensive held due to presentation of acute GI bleeding -Blood pressure low end of normal 107/77 today   DVT prophylaxis:  SCDs Start: 11/30/19 2243  Code Status: Full code Family Communication: Wife at bedside Disposition Plan:   Status is: Inpatient  Remains inpatient appropriate because:Inpatient level of care appropriate due to severity of illness   Dispo: The patient is from: Home              Anticipated d/c is to: Home              Anticipated d/c date is: 1 day              Patient currently is not medically stable to d/c.  Advance diet today and monitor H&H overnight.  Hopeful discharge home pending GI clearance 8/26   Consultants:   GI  Procedures:   None  Antimicrobials:  Anti-infectives (From admission, onward)   None        Objective: Vitals:   12/03/19 2010 12/03/19 2108 12/04/19 0809 12/04/19 1100  BP: 111/75 110/76 106/78 107/77  Pulse: 81 90 90 82  Resp: 17 18 16 18   Temp: 98.4 F (36.9 C)  98.3 F (36.8 C) 98.4 F (36.9 C) 98.6 F (37 C)  TempSrc: Oral Oral Oral Oral  SpO2: 95% 97% 92% 92%  Weight:      Height:        Intake/Output Summary (Last 24 hours) at 12/04/2019 1350 Last data filed at 12/04/2019 1100 Gross per 24 hour  Intake 1342.06 ml  Output 875 ml  Net 467.06 ml   Filed Weights   12/01/19 0541 12/02/19 0519 12/03/19 0440  Weight: 97.4 kg 90.2 kg 90.1 kg    Examination:  General exam: Appears calm and comfortable  Respiratory system: Clear to auscultation. Respiratory effort normal. No respiratory distress. No conversational dyspnea.  Cardiovascular system: S1 & S2 heard, RRR. No murmurs. No pedal edema. Gastrointestinal system: Abdomen is nondistended, soft and nontender. Normal bowel sounds heard. Central nervous system: Alert and oriented. No focal neurological deficits. Speech clear.  Extremities: Symmetric in appearance  Skin: No rashes, lesions or ulcers on exposed skin  Psychiatry: Judgement and insight appear normal. Mood & affect appropriate.   Data Reviewed: I have personally reviewed following labs and imaging studies  CBC: Recent Labs  Lab 12/01/19 0736 12/01/19 1625 12/03/19 0412 12/03/19 0929 12/03/19 1337 12/03/19 1658 12/04/19 0816  WBC 8.6  --   --  8.0 7.3 8.5 6.1  NEUTROABS  --   --   --  5.7  --   --   --   HGB 9.9*   < > 7.5* 7.8* 7.5* 8.2* 7.4*  HCT 30.0*   < > 22.7* 23.6* 22.7* 25.1* 22.8*  MCV 87.2  --   --  89.4 89.7 89.6 90.8  PLT 201  --   --  196 191 219 215   < > = values in this interval not displayed.   Basic Metabolic Panel: Recent Labs  Lab 11/30/19 1316  NA 138  K 4.5  CL 101  CO2 25  GLUCOSE 112*  BUN 16  CREATININE 0.89  CALCIUM 9.6   GFR: Estimated Creatinine Clearance: 86 mL/min (by C-G formula based on SCr of 0.89 mg/dL). Liver Function Tests: Recent Labs  Lab 11/30/19 1316  AST 23  ALT 16  ALKPHOS 53  BILITOT 0.8  PROT 6.8  ALBUMIN 4.0   No results for input(s): LIPASE,  AMYLASE in the last 168 hours. No results for input(s): AMMONIA in the last 168 hours. Coagulation Profile: Recent Labs  Lab 11/30/19 2218  INR 1.2   Cardiac Enzymes: No results for input(s): CKTOTAL, CKMB, CKMBINDEX, TROPONINI in the last 168 hours. BNP (last 3 results) No results for input(s): PROBNP in the last 8760 hours. HbA1C: No results for input(s): HGBA1C in the last 72 hours. CBG: No results for input(s): GLUCAP in the last 168 hours. Lipid Profile: No results for input(s): CHOL, HDL, LDLCALC, TRIG, CHOLHDL, LDLDIRECT in the last 72 hours. Thyroid Function Tests: No results for input(s): TSH, T4TOTAL, FREET4, T3FREE, THYROIDAB in the last 72 hours. Anemia Panel: No results for input(s): VITAMINB12, FOLATE, FERRITIN, TIBC, IRON, RETICCTPCT in the last 72 hours. Sepsis Labs: No results for input(s): PROCALCITON, LATICACIDVEN in the last 168 hours.  Recent Results (from the past 240 hour(s))  SARS Coronavirus 2 by RT PCR (hospital order, performed in [facility] hospital lab) Nasopharyngeal Nasopharyngeal Swab     Status: None   Collection Time: 11/30/19  9:34 PM   Specimen: Nasopharyngeal Swab  Result Value Ref Range Status   SARS Coronavirus 2 NEGATIVE NEGATIVE Final    Comment: (NOTE) SARS-CoV-2 target nucleic acids are NOT DETECTED.  The SARS-CoV-2 RNA is generally detectable in upper and lower respiratory specimens during the acute phase of infection. The lowest concentration of SARS-CoV-2 viral copies this assay can detect is 250 copies / mL. A negative result does not preclude SARS-CoV-2 infection and should not be used as the sole basis for treatment or other patient management decisions.  A negative result may occur with improper specimen collection / handling, submission of specimen other than nasopharyngeal swab, presence of viral mutation(s) within the areas targeted by this assay, and inadequate number of viral copies (<250 copies / mL). A negative  result must be combined with clinical observations, patient history, and epidemiological information.  Fact Sheet for Patients:   BoilerBrush.com.cy  Fact Sheet for Healthcare Providers: https://pope.com/  This test is not yet approved or  cleared by the [place] FDA and has been authorized for detection and/or diagnosis of SARS-CoV-2 by FDA under an Emergency Use Authorization (EUA).  This EUA will remain in effect (meaning this test can be used) for the duration of the COVID-19 declaration under Section 564(b)(1) of the Act, 21 U.S.C. section 360bbb-3(b)(1), unless the authorization is terminated or revoked sooner.  Performed at [facility], [address] [place], [place] [address]       Radiology Studies: No results found.  Scheduled Meds:  polyethylene glycol  17 g Oral BID   Continuous Infusions:   LOS: 4 days      Time spent: 35 minutes   Noralee Stain, DO [facility] 12/04/2019, 1:50 PM   Available via Epic secure chat 7am-7pm After these hours, please refer to coverage provider listed on [email or web address]

## 2019-12-04 NOTE — Progress Notes (Signed)
No bowel movements, despite being on MiraLAX.  Feels well.  No evidence of bleeding.    Hemoglobin stable over past 24 hours, with slight variation up and down.  Vitals have been unremarkable.  Impression: Quiescent (probable) diverticular bleed  Recommendations:  1.  Advance to solid diet  2.  Change IV fluids to saline lock  3.  I encouraged ambulation in the hallway in anticipation of hopeful discharge tomorrow if stable  Florencia Reasons, M.D. Pager [phone] If no answer or after 5 PM call [phone]

## 2019-12-05 LAB — CBC
HCT: 22 % — ABNORMAL LOW (ref 39.0–52.0)
Hemoglobin: 7 g/dL — ABNORMAL LOW (ref 13.0–17.0)
MCH: 29.2 pg (ref 26.0–34.0)
MCHC: 31.8 g/dL (ref 30.0–36.0)
MCV: 91.7 fL (ref 80.0–100.0)
Platelets: 211 10*3/uL (ref 150–400)
RBC: 2.4 MIL/uL — ABNORMAL LOW (ref 4.22–5.81)
RDW: 14.8 % (ref 11.5–15.5)
WBC: 6.2 10*3/uL (ref 4.0–10.5)
nRBC: 0 % (ref 0.0–0.2)

## 2019-12-05 LAB — HEMOGLOBIN AND HEMATOCRIT, BLOOD
HCT: 23.2 % — ABNORMAL LOW (ref 39.0–52.0)
Hemoglobin: 7.4 g/dL — ABNORMAL LOW (ref 13.0–17.0)

## 2019-12-05 NOTE — Progress Notes (Signed)
Pt.stated he  had bowel movement to medium soft  dark stool.

## 2019-12-05 NOTE — Progress Notes (Signed)
Patient had run of SVT at 1344.  Patient asymptomatic and states he was up moving around in room.  Dr. Alvino Chapel notified and aware.

## 2019-12-05 NOTE — Progress Notes (Signed)
PROGRESS NOTE    [name]  OEV:[id number] DOB: [birth year] DOA: 11/30/2019 PCP: Johny Blamer, MD     Brief Narrative:  Juan Moore is a 69 year old Caucasian male with past medical history significant for pancolonic diverticulosis, hyperlipidemia, and hypertension. Patient was admitted with bright red blood per rectum. Hgb 9.4 on admission with slow trend downward. GI consulted.   New events last 24 hours / Subjective: Had large amount of black tarry stool yesterday late afternoon.  Feeling well overall, able to ambulate without issues, tolerating regular diet.  Assessment & Plan:   Principal Problem:   Acute lower GI bleeding Active Problems:   Acute blood loss anemia   Leukocytosis   Hypertension   Left bundle branch block   Lower GI bleed, suspected diverticular -Appreciate GI, need to continue to monitor inpatient due to concern for rebleeding.  If patient with active acute hemorrhage, consider CT angiogram -Hold aspirin  Acute symptomatic blood loss anemia -Transfuse for hemoglobin less than 7 -Monitor H&H   Essential hypertension -Antihypertensive held due to presentation of acute GI bleeding -Blood pressure low end of normal 98/69 today   DVT prophylaxis:  SCDs Start: 11/30/19 2243  Code Status: Full code Family Communication: Wife at bedside Disposition Plan:   Status is: Inpatient  Remains inpatient appropriate because:Inpatient level of care appropriate due to severity of illness   Dispo: The patient is from: Home              Anticipated d/c is to: Home              Anticipated d/c date is: 1 day              Patient currently is not medically stable to d/c.  Continue to monitor for concern of recurrent GI bleeding.  Hopeful discharge home pending GI clearance 8/27.    Consultants:   GI  Procedures:   None  Antimicrobials:  Anti-infectives (From admission, onward)   None       Objective: Vitals:   12/04/19 1605 12/04/19 2035  12/05/19 0500 12/05/19 0543  BP: 99/68 98/69    Pulse: 91 93  85  Resp: 18 20    Temp: 98.3 F (36.8 C) 98.1 F (36.7 C)  98.1 F (36.7 C)  TempSrc: Oral Oral  Oral  SpO2: 93% 94%    Weight:   90.9 kg   Height:        Intake/Output Summary (Last 24 hours) at 12/05/2019 1224 Last data filed at 12/04/2019 2000 Gross per 24 hour  Intake 360 ml  Output --  Net 360 ml   Filed Weights   12/02/19 0519 12/03/19 0440 12/05/19 0500  Weight: 90.2 kg 90.1 kg 90.9 kg    Examination: General exam: Appears calm and comfortable  Respiratory system: Clear to auscultation. Respiratory effort normal. Cardiovascular system: S1 & S2 heard, RRR. No pedal edema. Gastrointestinal system: Abdomen is nondistended, soft and nontender. Normal bowel sounds heard. Central nervous system: Alert and oriented. Non focal exam. Speech clear  Extremities: Symmetric in appearance bilaterally  Skin: No rashes, lesions or ulcers on exposed skin  Psychiatry: Judgement and insight appear stable. Mood & affect appropriate.    Data Reviewed: I have personally reviewed following labs and imaging studies  CBC: Recent Labs  Lab 12/03/19 0929 12/03/19 0929 12/03/19 1337 12/03/19 1658 12/04/19 0816 12/04/19 1834 12/05/19 0536  WBC 8.0   < > 7.3 8.5 6.1 6.8 6.2  NEUTROABS 5.7  --   --   --   --   --   --  HGB 7.8*   < > 7.5* 8.2* 7.4* 7.4* 7.0*  HCT 23.6*   < > 22.7* 25.1* 22.8* 23.1* 22.0*  MCV 89.4   < > 89.7 89.6 90.8 90.6 91.7  PLT 196   < > 191 219 215 219 211   < > = values in this interval not displayed.   Basic Metabolic Panel: Recent Labs  Lab 11/30/19 1316  NA 138  K 4.5  CL 101  CO2 25  GLUCOSE 112*  BUN 16  CREATININE 0.89  CALCIUM 9.6   GFR: Estimated Creatinine Clearance: 86 mL/min (by C-G formula based on SCr of 0.89 mg/dL). Liver Function Tests: Recent Labs  Lab 11/30/19 1316  AST 23  ALT 16  ALKPHOS 53  BILITOT 0.8  PROT 6.8  ALBUMIN 4.0   No results for input(s):  LIPASE, AMYLASE in the last 168 hours. No results for input(s): AMMONIA in the last 168 hours. Coagulation Profile: Recent Labs  Lab 11/30/19 2218  INR 1.2   Cardiac Enzymes: No results for input(s): CKTOTAL, CKMB, CKMBINDEX, TROPONINI in the last 168 hours. BNP (last 3 results) No results for input(s): PROBNP in the last 8760 hours. HbA1C: No results for input(s): HGBA1C in the last 72 hours. CBG: No results for input(s): GLUCAP in the last 168 hours. Lipid Profile: No results for input(s): CHOL, HDL, LDLCALC, TRIG, CHOLHDL, LDLDIRECT in the last 72 hours. Thyroid Function Tests: No results for input(s): TSH, T4TOTAL, FREET4, T3FREE, THYROIDAB in the last 72 hours. Anemia Panel: No results for input(s): VITAMINB12, FOLATE, FERRITIN, TIBC, IRON, RETICCTPCT in the last 72 hours. Sepsis Labs: No results for input(s): PROCALCITON, LATICACIDVEN in the last 168 hours.  Recent Results (from the past 240 hour(s))  SARS Coronavirus 2 by RT PCR (hospital order, performed in [facility] hospital lab) Nasopharyngeal Nasopharyngeal Swab     Status: None   Collection Time: 11/30/19  9:34 PM   Specimen: Nasopharyngeal Swab  Result Value Ref Range Status   SARS Coronavirus 2 NEGATIVE NEGATIVE Final    Comment: (NOTE) SARS-CoV-2 target nucleic acids are NOT DETECTED.  The SARS-CoV-2 RNA is generally detectable in upper and lower respiratory specimens during the acute phase of infection. The lowest concentration of SARS-CoV-2 viral copies this assay can detect is 250 copies / mL. A negative result does not preclude SARS-CoV-2 infection and should not be used as the sole basis for treatment or other patient management decisions.  A negative result may occur with improper specimen collection / handling, submission of specimen other than nasopharyngeal swab, presence of viral mutation(s) within the areas targeted by this assay, and inadequate number of viral copies (<250 copies / mL). A  negative result must be combined with clinical observations, patient history, and epidemiological information.  Fact Sheet for Patients:   BoilerBrush.com.cy  Fact Sheet for Healthcare Providers: https://pope.com/  This test is not yet approved or  cleared by the [place] FDA and has been authorized for detection and/or diagnosis of SARS-CoV-2 by FDA under an Emergency Use Authorization (EUA).  This EUA will remain in effect (meaning this test can be used) for the duration of the COVID-19 declaration under Section 564(b)(1) of the Act, 21 U.S.C. section 360bbb-3(b)(1), unless the authorization is terminated or revoked sooner.  Performed at [facility], [address] [place], [place] [address]       Radiology Studies: No results found.    Scheduled Meds: . polyethylene glycol  17 g Oral BID  Continuous Infusions:   LOS: 5 days      Time spent: 20 minutes   Noralee Stain, DO [facility] 12/05/2019, 12:24 PM   Available via Epic secure chat 7am-7pm After these hours, please refer to coverage provider listed on [email or web address]

## 2019-12-05 NOTE — Progress Notes (Signed)
Minor drop in hemoglobin over the past 24 hours, from 7.4 yesterday morning and yesterday evening, to a current level of 7.0. There was a transient drop in BP, 96/54, without tachycardia, yesterday afternoon.  Yesterday, he did pass a large, glob of what appears to be old blood.  He has not had further bloody bowel movements beyond that.  He is on a regular diet and he walked in the halls yesterday without significant weakness.  Impression: I suspect he may have had a transient, self-limited recurrent bleed yesterday afternoon, but he does not show signs of active bleeding at present.  Recommendation: In view of the fact that he dropped his blood pressure, dropped his hemoglobin, and passed blood, I think we have to assume that he rebled yesterday.  Normally, I recommend 2 days of observation without bleeding prior to discharging a patient with diverticular hemorrhage.  Therefore, I would recommend the patient remain in the hospital today, continue ambulation for strengthening, and that we would reassess tomorrow to see if he could possibly be discharged at that time.  Today, if he should have active acute hemorrhage, I would consider getting a CT angiogram.  Florencia Reasons, M.D. Pager [phone] If no answer or after 5 PM call [phone]

## 2019-12-06 LAB — HEMOGLOBIN AND HEMATOCRIT, BLOOD
HCT: 23.7 % — ABNORMAL LOW (ref 39.0–52.0)
Hemoglobin: 7.8 g/dL — ABNORMAL LOW (ref 13.0–17.0)

## 2019-12-06 LAB — CBC
HCT: 22.5 % — ABNORMAL LOW (ref 39.0–52.0)
Hemoglobin: 7.3 g/dL — ABNORMAL LOW (ref 13.0–17.0)
MCH: 30.3 pg (ref 26.0–34.0)
MCHC: 32.4 g/dL (ref 30.0–36.0)
MCV: 93.4 fL (ref 80.0–100.0)
Platelets: 254 10*3/uL (ref 150–400)
RBC: 2.41 MIL/uL — ABNORMAL LOW (ref 4.22–5.81)
RDW: 15.5 % (ref 11.5–15.5)
WBC: 6.6 10*3/uL (ref 4.0–10.5)
nRBC: 0 % (ref 0.0–0.2)

## 2019-12-06 MED ORDER — POLYETHYLENE GLYCOL 3350 17 G PO PACK
17.0000 g | PACK | Freq: Once | ORAL | Status: AC
  Administered 2019-12-06: 17 g via ORAL
  Filled 2019-12-06: qty 1

## 2019-12-06 NOTE — Progress Notes (Addendum)
Hgb creeping upward over past 24 hrs, from 7.0 to 7.8, without transfusion.  Old-bloody BM last night, none since.  VSS  Impression:  I think it's been almost 48 hrs since last bleeding episode.   Recommend:  I would favor roughly another 24 hrs of observation--ok for dischg tomorrow morning if stable.  Although pt is stable at the moment, diverticular hemorrhage has a propensity for recurring after a day or two of quiescence, so I think he should remain under observation another day, particularly considering the severity of his initial bleed (requiring 2 u prc's), his current marginal hgb, and his age, not to mention the logjam currently existing in the ER which would put him at risk if he had to be re-admitted.   Recomm pt call PCP for f/u ov for about 1 week post dischg, and call Dr. Dulce Sellar for GI f/u about 1 month post dischg  Would have pt hold ASA until he sees his PCP, at which time it could be safely resumed if it is felt to be clinically important for him to be on it.  Florencia Reasons, M.D. Pager [phone] If no answer or after 5 PM call [phone]

## 2019-12-06 NOTE — Care Management Important Message (Signed)
Important Message  Patient Details  Name: Juan Moore MRN: [id number] Date of Birth: 1950/09/07   Medicare Important Message Given:  Yes     Renie Ora 12/06/2019, 11:04 AM

## 2019-12-06 NOTE — Progress Notes (Signed)
PROGRESS NOTE    Juan Moore  UTM:[id number] DOB: 03/20/1951 DOA: 11/30/2019 PCP: Johny Blamer, MD     Brief Narrative:  Juan Moore is a 69 year old Caucasian male with past medical history significant for pancolonic diverticulosis, hyperlipidemia, and hypertension. Patient was admitted with bright red blood per rectum. Hgb 9.4 on admission with slow trend downward. GI consulted.   New events last 24 hours / Subjective: Had another bowel movement yesterday which was dark red in nature.  No other new complaints.  Assessment & Plan:   Principal Problem:   Acute lower GI bleeding Active Problems:   Acute blood loss anemia   Leukocytosis   Hypertension   Left bundle branch block   Lower GI bleed, suspected diverticular -Appreciate GI, need to continue to monitor inpatient due to concern for rebleeding.  If patient with active acute hemorrhage, consider CT angiogram -Hold aspirin until follow-up with PCP  Acute symptomatic blood loss anemia -Transfuse for hemoglobin less than 7 -Monitor H&H   Essential hypertension -Antihypertensive held due to presentation of acute GI bleeding -Blood pressure low end of normal 105/70 today   DVT prophylaxis:  SCDs Start: 11/30/19 2243  Code Status: Full code Family Communication: No family bedside Disposition Plan:   Status is: Inpatient  Remains inpatient appropriate because:Inpatient level of care appropriate due to severity of illness   Dispo: The patient is from: Home              Anticipated d/c is to: Home              Anticipated d/c date is: 1 day              Patient currently is not medically stable to d/c.  Continue to monitor for concern of recurrent GI bleeding.  Hopeful discharge home pending GI clearance 8/28.    Consultants:   GI  Procedures:   None  Antimicrobials:  Anti-infectives (From admission, onward)   None       Objective: Vitals:   12/05/19 0500 12/05/19 0543 12/05/19 2050 12/06/19  0611  BP:   123/74 105/70  Pulse:  85 (!) 103 82  Resp:   20 16  Temp:  98.1 F (36.7 C) 98.9 F (37.2 C) 98.3 F (36.8 C)  TempSrc:  Oral Oral Oral  SpO2:   96% 93%  Weight: 90.9 kg   90.3 kg  Height:       No intake or output data in the 24 hours ending 12/06/19 1214 Filed Weights   12/03/19 0440 12/05/19 0500 12/06/19 0611  Weight: 90.1 kg 90.9 kg 90.3 kg    Examination: General exam: Appears calm and comfortable  Respiratory system: Clear to auscultation. Respiratory effort normal. Cardiovascular system: S1 & S2 heard, RRR. No pedal edema. Gastrointestinal system: Abdomen is nondistended, soft and nontender. Normal bowel sounds heard. Central nervous system: Alert and oriented. Non focal exam. Speech clear  Extremities: Symmetric in appearance bilaterally  Skin: No rashes, lesions or ulcers on exposed skin  Psychiatry: Judgement and insight appear stable. Mood & affect appropriate.    Data Reviewed: I have personally reviewed following labs and imaging studies  CBC: Recent Labs  Lab 12/03/19 0929 12/03/19 0929 12/03/19 1337 12/03/19 1337 12/03/19 1658 12/03/19 1658 12/04/19 0816 12/04/19 1834 12/05/19 0536 12/05/19 1751 12/06/19 0452  WBC 8.0   < > 7.3  --  8.5  --  6.1 6.8 6.2  --   --   NEUTROABS 5.7  --   --   --   --   --   --   --   --   --   --  HGB 7.8*   < > 7.5*   < > 8.2*   < > 7.4* 7.4* 7.0* 7.4* 7.8*  HCT 23.6*   < > 22.7*   < > 25.1*   < > 22.8* 23.1* 22.0* 23.2* 23.7*  MCV 89.4   < > 89.7  --  89.6  --  90.8 90.6 91.7  --   --   PLT 196   < > 191  --  219  --  215 219 211  --   --    < > = values in this interval not displayed.   Basic Metabolic Panel: Recent Labs  Lab 11/30/19 1316  NA 138  K 4.5  CL 101  CO2 25  GLUCOSE 112*  BUN 16  CREATININE 0.89  CALCIUM 9.6   GFR: Estimated Creatinine Clearance: 86 mL/min (by C-G formula based on SCr of 0.89 mg/dL). Liver Function Tests: Recent Labs  Lab 11/30/19 1316  AST 23  ALT 16    ALKPHOS 53  BILITOT 0.8  PROT 6.8  ALBUMIN 4.0   No results for input(s): LIPASE, AMYLASE in the last 168 hours. No results for input(s): AMMONIA in the last 168 hours. Coagulation Profile: Recent Labs  Lab 11/30/19 2218  INR 1.2   Cardiac Enzymes: No results for input(s): CKTOTAL, CKMB, CKMBINDEX, TROPONINI in the last 168 hours. BNP (last 3 results) No results for input(s): PROBNP in the last 8760 hours. HbA1C: No results for input(s): HGBA1C in the last 72 hours. CBG: No results for input(s): GLUCAP in the last 168 hours. Lipid Profile: No results for input(s): CHOL, HDL, LDLCALC, TRIG, CHOLHDL, LDLDIRECT in the last 72 hours. Thyroid Function Tests: No results for input(s): TSH, T4TOTAL, FREET4, T3FREE, THYROIDAB in the last 72 hours. Anemia Panel: No results for input(s): VITAMINB12, FOLATE, FERRITIN, TIBC, IRON, RETICCTPCT in the last 72 hours. Sepsis Labs: No results for input(s): PROCALCITON, LATICACIDVEN in the last 168 hours.  Recent Results (from the past 240 hour(s))  SARS Coronavirus 2 by RT PCR (hospital order, performed in [facility] hospital lab) Nasopharyngeal Nasopharyngeal Swab     Status: None   Collection Time: 11/30/19  9:34 PM   Specimen: Nasopharyngeal Swab  Result Value Ref Range Status   SARS Coronavirus 2 NEGATIVE NEGATIVE Final    Comment: (NOTE) SARS-CoV-2 target nucleic acids are NOT DETECTED.  The SARS-CoV-2 RNA is generally detectable in upper and lower respiratory specimens during the acute phase of infection. The lowest concentration of SARS-CoV-2 viral copies this assay can detect is 250 copies / mL. A negative result does not preclude SARS-CoV-2 infection and should not be used as the sole basis for treatment or other patient management decisions.  A negative result may occur with improper specimen collection / handling, submission of specimen other than nasopharyngeal swab, presence of viral mutation(s) within the areas targeted  by this assay, and inadequate number of viral copies (<250 copies / mL). A negative result must be combined with clinical observations, patient history, and epidemiological information.  Fact Sheet for Patients:   BoilerBrush.com.cy  Fact Sheet for Healthcare Providers: https://pope.com/  This test is not yet approved or  cleared by the [place] FDA and has been authorized for detection and/or diagnosis of SARS-CoV-2 by FDA under an Emergency Use Authorization (EUA).  This EUA will remain in effect (meaning this test can be used) for the duration of the COVID-19 declaration under Section 564(b)(1) of the Act, 21 U.S.C. section 360bbb-3(b)(1), unless the authorization  is terminated or revoked sooner.  Performed at [facility], [address] [place], [place] [address]       Radiology Studies: No results found.    Scheduled Meds: . polyethylene glycol  17 g Oral Once   Continuous Infusions:   LOS: 6 days      Time spent: 20 minutes   Noralee Stain, DO [facility] 12/06/2019, 12:14 PM   Available via Epic secure chat 7am-7pm After these hours, please refer to coverage provider listed on [email or web address]

## 2019-12-07 LAB — CBC
HCT: 23 % — ABNORMAL LOW (ref 39.0–52.0)
Hemoglobin: 7.4 g/dL — ABNORMAL LOW (ref 13.0–17.0)
MCH: 30 pg (ref 26.0–34.0)
MCHC: 32.2 g/dL (ref 30.0–36.0)
MCV: 93.1 fL (ref 80.0–100.0)
Platelets: 244 10*3/uL (ref 150–400)
RBC: 2.47 MIL/uL — ABNORMAL LOW (ref 4.22–5.81)
RDW: 15.6 % — ABNORMAL HIGH (ref 11.5–15.5)
WBC: 7.2 10*3/uL (ref 4.0–10.5)
nRBC: 0 % (ref 0.0–0.2)

## 2019-12-07 NOTE — Discharge Summary (Signed)
Physician Discharge Summary  Juan Moore ZOX:[id number] DOB: 05/09/1950 DOA: 11/30/2019  PCP: Johny BlamerHarris, William, MD  Admit date: 11/30/2019 Discharge date: 12/07/2019  Admitted From: Home Disposition:  Home  Recommendations for Outpatient Follow-up:  1. Follow up with PCP in 1 week 2. Follow up with GI Dr. Dulce Sellarutlaw in 1 month 3. Follow up CBC outpatient in 1 week   Discharge Condition: Stable CODE STATUS: Full  Diet recommendation: Regular  Brief/Interim Summary: Juan Moore is a 69 year old Caucasian male with past medical history significant for pancolonic diverticulosis, hyperlipidemia, and hypertension. Patient was admitted with bright red blood per rectum. Hgb 9.4 on admission with slow trend downward. GI consulted. Patient was monitored closely for GI bleeding. He was transfused 2u pRBC 8/22. He had no further brisk bleed and Hgb remained low but stable. He was discharged home with instruction to hold aspirin and lotensin until outpatient follow up.   Discharge Diagnoses:  Principal Problem:   Acute lower GI bleeding Active Problems:   Acute blood loss anemia   Leukocytosis   Hypertension   Left bundle branch block   Lower GI bleed, suspected diverticular -Appreciate GI -Hold aspirin until follow-up with PCP  Acute symptomatic blood loss anemia -Transfuse for hemoglobin less than 7. S/p 2u pRBC 8/22 -Repeat CBC in 1 week   Essential hypertension -Antihypertensive held due to presentation of acute GI bleeding -Resume as outpatient after PCP follow up    Discharge Instructions  Discharge Instructions    Call MD for:  difficulty breathing, headache or visual disturbances   Complete by: As directed    Call MD for:  extreme fatigue   Complete by: As directed    Call MD for:  persistant dizziness or light-headedness   Complete by: As directed    Call MD for:  persistant nausea and vomiting   Complete by: As directed    Call MD for:  severe uncontrolled pain    Complete by: As directed    Call MD for:  temperature >100.4   Complete by: As directed    Diet general   Complete by: As directed    Discharge instructions   Complete by: As directed    You were cared for by a hospitalist during your hospital stay. If you have any questions about your discharge medications or the care you received while you were in the hospital after you are discharged, you can call the unit and ask to speak with the hospitalist on call if the hospitalist that took care of you is not available. Once you are discharged, your primary care physician will handle any further medical issues. Please note that NO REFILLS for any discharge medications will be authorized once you are discharged, as it is imperative that you return to your primary care physician (or establish a relationship with a primary care physician if you do not have one) for your aftercare needs so that they can reassess your need for medications and monitor your lab values.   Increase activity slowly   Complete by: As directed      Allergies as of 12/07/2019   No Known Allergies     Medication List    STOP taking these medications   aspirin EC 81 MG tablet   benazepril-hydrochlorthiazide 10-12.5 MG tablet Commonly known as: LOTENSIN HCT     TAKE these medications   acetaminophen 500 MG tablet Commonly known as: TYLENOL Take 1,000 mg by mouth every 6 (six) hours as needed for headache (pain).  ALIGN PO Take 1 tablet by mouth.   ascorbic acid 500 MG tablet Commonly known as: VITAMIN C Take 500 mg by mouth daily.   hydrocortisone 25 MG suppository Commonly known as: ANUSOL-HC Place 25 mg rectally 2 (two) times daily.   ICY HOT EX Apply 1 application topically daily as needed (shoulder pain).   multivitamin with minerals Tabs tablet Take 1 tablet by mouth daily.   simvastatin 40 MG tablet Commonly known as: ZOCOR Take 40 mg by mouth at bedtime.       No Known  Allergies  Consultations:  GI    Procedures/Studies: No results found.     Discharge Exam: Vitals:   12/06/19 2049 12/07/19 0521  BP: 110/75 113/83  Pulse: 96 91  Resp: 18 17  Temp: 98.9 F (37.2 C) 98.3 F (36.8 C)  SpO2: 96% 93%     General: Pt is alert, awake, not in acute distress Cardiovascular: RRR, S1/S2 +, no edema Respiratory: CTA bilaterally, no wheezing, no rhonchi, no respiratory distress, no conversational dyspnea  Abdominal: Soft, NT, ND, bowel sounds + Extremities: no edema, no cyanosis Psych: Normal mood and affect, stable judgement and insight     The results of significant diagnostics from this hospitalization (including imaging, microbiology, ancillary and laboratory) are listed below for reference.     Microbiology: Recent Results (from the past 240 hour(s))  SARS Coronavirus 2 by RT PCR (hospital order, performed in [facility] hospital lab) Nasopharyngeal Nasopharyngeal Swab     Status: None   Collection Time: 11/30/19  9:34 PM   Specimen: Nasopharyngeal Swab  Result Value Ref Range Status   SARS Coronavirus 2 NEGATIVE NEGATIVE Final    Comment: (NOTE) SARS-CoV-2 target nucleic acids are NOT DETECTED.  The SARS-CoV-2 RNA is generally detectable in upper and lower respiratory specimens during the acute phase of infection. The lowest concentration of SARS-CoV-2 viral copies this assay can detect is 250 copies / mL. A negative result does not preclude SARS-CoV-2 infection and should not be used as the sole basis for treatment or other patient management decisions.  A negative result may occur with improper specimen collection / handling, submission of specimen other than nasopharyngeal swab, presence of viral mutation(s) within the areas targeted by this assay, and inadequate number of viral copies (<250 copies / mL). A negative result must be combined with clinical observations, patient history, and epidemiological information.  Fact  Sheet for Patients:   BoilerBrush.com.cy  Fact Sheet for Healthcare Providers: https://pope.com/  This test is not yet approved or  cleared by the [place] FDA and has been authorized for detection and/or diagnosis of SARS-CoV-2 by FDA under an Emergency Use Authorization (EUA).  This EUA will remain in effect (meaning this test can be used) for the duration of the COVID-19 declaration under Section 564(b)(1) of the Act, 21 U.S.C. section 360bbb-3(b)(1), unless the authorization is terminated or revoked sooner.  Performed at [facility], [address], [place], [place] [address]      Labs: BNP (last 3 results) No results for input(s): BNP in the last 8760 hours. Basic Metabolic Panel: Recent Labs  Lab 11/30/19 1316  NA 138  K 4.5  CL 101  CO2 25  GLUCOSE 112*  BUN 16  CREATININE 0.89  CALCIUM 9.6   Liver Function Tests: Recent Labs  Lab 11/30/19 1316  AST 23  ALT 16  ALKPHOS 53  BILITOT 0.8  PROT 6.8  ALBUMIN 4.0   No results for  input(s): LIPASE, AMYLASE in the last 168 hours. No results for input(s): AMMONIA in the last 168 hours. CBC: Recent Labs  Lab 12/03/19 0929 12/03/19 1337 12/04/19 0816 12/04/19 0816 12/04/19 1834 12/04/19 1834 12/05/19 0536 12/05/19 1751 12/06/19 0452 12/06/19 1656 12/07/19 0212  WBC 8.0   < > 6.1  --  6.8  --  6.2  --   --  6.6 7.2  NEUTROABS 5.7  --   --   --   --   --   --   --   --   --   --   HGB 7.8*   < > 7.4*   < > 7.4*   < > 7.0* 7.4* 7.8* 7.3* 7.4*  HCT 23.6*   < > 22.8*   < > 23.1*   < > 22.0* 23.2* 23.7* 22.5* 23.0*  MCV 89.4   < > 90.8  --  90.6  --  91.7  --   --  93.4 93.1  PLT 196   < > 215  --  219  --  211  --   --  254 244   < > = values in this interval not displayed.   Cardiac Enzymes: No results for input(s): CKTOTAL, CKMB, CKMBINDEX, TROPONINI in the last 168 hours. BNP: Invalid input(s): POCBNP CBG: No results for input(s): GLUCAP in  the last 168 hours. D-Dimer No results for input(s): DDIMER in the last 72 hours. Hgb A1c No results for input(s): HGBA1C in the last 72 hours. Lipid Profile No results for input(s): CHOL, HDL, LDLCALC, TRIG, CHOLHDL, LDLDIRECT in the last 72 hours. Thyroid function studies No results for input(s): TSH, T4TOTAL, T3FREE, THYROIDAB in the last 72 hours.  Invalid input(s): FREET3 Anemia work up No results for input(s): VITAMINB12, FOLATE, FERRITIN, TIBC, IRON, RETICCTPCT in the last 72 hours. Urinalysis No results found for: COLORURINE, APPEARANCEUR, LABSPEC, PHURINE, GLUCOSEU, HGBUR, BILIRUBINUR, KETONESUR, PROTEINUR, UROBILINOGEN, NITRITE, LEUKOCYTESUR Sepsis Labs Invalid input(s): PROCALCITONIN,  WBC,  LACTICIDVEN Microbiology Recent Results (from the past 240 hour(s))  SARS Coronavirus 2 by RT PCR (hospital order, performed in [facility] hospital lab) Nasopharyngeal Nasopharyngeal Swab     Status: None   Collection Time: 11/30/19  9:34 PM   Specimen: Nasopharyngeal Swab  Result Value Ref Range Status   SARS Coronavirus 2 NEGATIVE NEGATIVE Final    Comment: (NOTE) SARS-CoV-2 target nucleic acids are NOT DETECTED.  The SARS-CoV-2 RNA is generally detectable in upper and lower respiratory specimens during the acute phase of infection. The lowest concentration of SARS-CoV-2 viral copies this assay can detect is 250 copies / mL. A negative result does not preclude SARS-CoV-2 infection and should not be used as the sole basis for treatment or other patient management decisions.  A negative result may occur with improper specimen collection / handling, submission of specimen other than nasopharyngeal swab, presence of viral mutation(s) within the areas targeted by this assay, and inadequate number of viral copies (<250 copies / mL). A negative result must be combined with clinical observations, patient history, and epidemiological information.  Fact Sheet for Patients:    BoilerBrush.com.cy  Fact Sheet for Healthcare Providers: https://pope.com/  This test is not yet approved or  cleared by the [place] FDA and has been authorized for detection and/or diagnosis of SARS-CoV-2 by FDA under an Emergency Use Authorization (EUA).  This EUA will remain in effect (meaning this test can be used) for the duration of the COVID-19 declaration under Section 564(b)(1) of the  Act, 21 U.S.C. section 360bbb-3(b)(1), unless the authorization is terminated or revoked sooner.  Performed at [facility], [address], [place], [place] [address]      Patient was seen and examined on the day of discharge and was found to be in stable condition. Time coordinating discharge: 25 minutes including assessment and coordination of care, as well as examination of the patient.   SIGNED:  Noralee Stain, DO [facility] 12/07/2019, 9:37 AM

## 2019-12-07 NOTE — Discharge Instructions (Signed)
Lower Gastrointestinal Bleeding  Lower gastrointestinal (GI) bleeding is the result of bleeding from the colon, rectum, or anal area. The colon is the last part of the digestive tract, where stool, also called feces, is formed. If you have lower GI bleeding, you may see blood in or on your stool. It may be bright red. Lower GI bleeding often stops without treatment. Continued or heavy bleeding needs emergency treatment at the hospital. What are the causes? Lower GI bleeding may be caused by:  A condition that causes pouches to form in the colon over time (diverticulosis).  Swelling and irritation (inflammation) in areas with diverticulosis (diverticulitis).  Inflammation of the colon (inflammatory bowel disease).  Swollen veins in the rectum (hemorrhoids).  Painful tears in the anus (anal fissures), often caused by passing hard stools.  Cancer of the colon or rectum.  Noncancerous growths (polyps) of the colon or rectum.  A bleeding disorder that impairs the formation of blood clots and causes easy bleeding (coagulopathy).  An abnormal weakening of a blood vessel where an artery and a vein come together (arteriovenous malformation). What increases the risk? You are more likely to develop this condition if:  You are older than 69 years of age.  You take aspirin or NSAIDs on a regular basis.  You take anticoagulant or antiplatelet drugs.  You have a history of high-dose X-ray treatment (radiation therapy) of the colon.  You recently had a colon polyp removed. What are the signs or symptoms? Symptoms of this condition include:  Bright red blood or blood clots coming from your rectum.  Bloody stools.  Black or maroon-colored stools.  Pain or cramping in the abdomen.  Weakness or dizziness.  Racing heartbeat. How is this diagnosed? This condition may be diagnosed based on:  Your symptoms and medical history.  A physical exam. During the exam, your health care  provider will check for signs of blood loss, such as low blood pressure and a rapid pulse.  Tests, such as: ? Flexible sigmoidoscopy. In this procedure, a flexible tube with a camera on the end is used to examine your anus and the first part of your colon to look for the source of bleeding. ? Colonoscopy. This is similar to a flexible sigmoidoscopy, but the camera can extend all the way to the uppermost part of your colon. ? Blood tests to measure your red blood cell count and to check for coagulopathy. ? An imaging study of your colon to look for a bleeding site. In some cases, you may have X-rays taken after a dye or radioactive substance is injected into your bloodstream (angiogram). How is this treated? Treatment for this condition depends on the cause of the bleeding. Heavy or persistent bleeding is treated at the hospital. Treatment may include:  Getting fluids through an IV tube inserted into one of your veins.  Getting blood through an IV tube (blood transfusion).  Stopping bleeding through high-heat coagulation, injections of certain medicines, or applying surgical clips. This can all be done during a colonoscopy.  Having a procedure that involves first doing an angiogram and then blocking blood flow to the bleeding site (embolization).  Stopping some of your regular medicines for a certain amount of time.  Having surgery to remove part of the colon. This may be needed if bleeding is severe and does not respond to other treatment. Follow these instructions at home:  Take over-the-counter and prescription medicines only as told by your health care provider. You may need to   avoid aspirin, NSAIDs, or other medicines that increase bleeding.  Eat foods that are high in fiber. This will help keep your stools soft. These foods include whole grains, legumes, fruits, and vegetables. Eating 1-3 prunes each day works well for many people.  Drink enough fluid to keep your urine clear or pale  yellow.  Keep all follow-up visits as told by your health care provider. This is important. Contact a health care provider if:  Your symptoms do not improve. Get help right away if:  Your bleeding increases.  You feel light-headed or you faint.  You feel weak.  You have severe cramps in your back or abdomen.  You pass large blood clots in your stool.  Your symptoms get worse. This information is not intended to replace advice given to you by your health care provider. Make sure you discuss any questions you have with your health care provider. Document Revised: 07/20/2018 Document Reviewed: 08/13/2015 Elsevier Patient Education  2020 [employer]  

## 2020-01-09 ENCOUNTER — Encounter (INDEPENDENT_AMBULATORY_CARE_PROVIDER_SITE_OTHER): Payer: Self-pay

## 2020-05-25 ENCOUNTER — Other Ambulatory Visit: Payer: Self-pay

## 2020-05-25 ENCOUNTER — Encounter (INDEPENDENT_AMBULATORY_CARE_PROVIDER_SITE_OTHER): Payer: Self-pay | Admitting: Ophthalmology

## 2020-05-25 ENCOUNTER — Ambulatory Visit (INDEPENDENT_AMBULATORY_CARE_PROVIDER_SITE_OTHER): Payer: Medicare HMO | Admitting: Ophthalmology

## 2020-05-25 DIAGNOSIS — H26491 Other secondary cataract, right eye: Secondary | ICD-10-CM

## 2020-05-25 DIAGNOSIS — H43813 Vitreous degeneration, bilateral: Secondary | ICD-10-CM

## 2020-05-25 DIAGNOSIS — H33323 Round hole, bilateral: Secondary | ICD-10-CM

## 2020-05-25 DIAGNOSIS — H35413 Lattice degeneration of retina, bilateral: Secondary | ICD-10-CM

## 2020-05-25 NOTE — Assessment & Plan Note (Signed)

## 2020-05-25 NOTE — Assessment & Plan Note (Signed)
Posterior capsule the right eye appears mostly clear yet does appear intact.  Findings by slit-lamp could in fact be the anterior hyaloid face yet there is a small capsule opacification nasal to the visual axis which is under the care of Dr. Izell Dupont

## 2020-05-25 NOTE — Assessment & Plan Note (Signed)
No new breaks today will observe

## 2020-05-25 NOTE — Assessment & Plan Note (Signed)
Will observe OU

## 2020-05-25 NOTE — Progress Notes (Signed)
05/25/2020     CHIEF COMPLAINT Patient presents for Retina Follow Up (1 YR FU OU, pt had a YAG procedure OU in October 2021.///Pt reports stable vision OU. Pt denies any new F/F, pain, or pressure OU. ///)   HISTORY OF PRESENT ILLNESS: Juan Moore is a 70 y.o. male who presents to the clinic today for:   HPI    Retina Follow Up    Patient presents with  Other.  In both eyes.  This started 1 year ago.  Severity is mild.  Duration of 1 year.  Since onset it is stable. Additional comments: 1 YR FU OU, pt had a YAG procedure OU in October 2021.   Pt reports stable vision OU. Pt denies any new F/F, pain, or pressure OU.           Last edited by Varney Biles D on 05/25/2020  8:10 AM. (History)      Referring physician: Johny Blamer, MD [phone] [address] [place],  [place] [address]  HISTORICAL INFORMATION:   Selected notes from the MEDICAL RECORD NUMBER       CURRENT MEDICATIONS: No current outpatient medications on file. (Ophthalmic Drugs)   No current facility-administered medications for this visit. (Ophthalmic Drugs)   Current Outpatient Medications (Other)  Medication Sig  . acetaminophen (TYLENOL) 500 MG tablet Take 1,000 mg by mouth every 6 (six) hours as needed for headache (pain).  Marland Kitchen ascorbic acid (VITAMIN C) 500 MG tablet Take 500 mg by mouth daily.  . hydrocortisone (ANUSOL-HC) 25 MG suppository Place 25 mg rectally 2 (two) times daily.  . Menthol, Topical Analgesic, (ICY HOT EX) Apply 1 application topically daily as needed (shoulder pain).  . Multiple Vitamin (MULTIVITAMIN WITH MINERALS) TABS tablet Take 1 tablet by mouth daily.  . Probiotic Product (ALIGN PO) Take 1 tablet by mouth.  . simvastatin (ZOCOR) 40 MG tablet Take 40 mg by mouth at bedtime.    No current facility-administered medications for this visit. (Other)      REVIEW OF SYSTEMS:    ALLERGIES No Known Allergies  PAST MEDICAL HISTORY History reviewed. No pertinent  past medical history. History reviewed. No pertinent surgical history.  FAMILY HISTORY History reviewed. No pertinent family history.  SOCIAL HISTORY Social History   Tobacco Use  . Smoking status: Never Smoker  . Smokeless tobacco: Never Used  Substance Use Topics  . Alcohol use: Not Currently  . Drug use: Not Currently         OPHTHALMIC EXAM: Base Eye Exam    Visual Acuity (ETDRS)      Right Left   Dist cc 20/20 20/20 -2   Correction: Glasses       Tonometry (Tonopen, 8:13 AM)      Right Left   Pressure 11 13       Pupils      Pupils Dark Light Shape React APD   Right PERRL 4 4 Round Brisk None   Left PERRL 3 3 Round Brisk None       Visual Fields (Counting fingers)      Left Right    Full Full       Extraocular Movement      Right Left    Full Full       Neuro/Psych    Oriented x3: Yes       Dilation    Both eyes: 1.0% Mydriacyl, 2.5% Phenylephrine @ 8:13 AM  Slit Lamp and Fundus Exam    External Exam      Right Left   External Normal Normal       Slit Lamp Exam      Right Left   Lids/Lashes Normal Normal   Conjunctiva/Sclera White and quiet White and quiet   Cornea Clear Clear   Anterior Chamber Deep and quiet Deep and quiet   Iris Round and reactive Round and reactive   Lens Centered posterior chamber intraocular lens, 1+ Posterior capsular opacification, but mostly clear, clear sequestered fluid Centered posterior chamber intraocular lens, Open posterior capsule   Anterior Vitreous Normal Normal       Fundus Exam      Right Left   Posterior Vitreous Posterior vitreous detachment Normal   Disc Small optic nerve head, hypoplastic Peripapillary atrophy, Hypoplasia   C/D Ratio 0.1 0.1   Macula Normal Normal   Vessels Normal Normal   Periphery Lattice degeneration, old break 11 o'clock, good pexy,  Chorioretinal scar at 7 good pexy 12-1, 5, 6, no new breaks          IMAGING AND PROCEDURES  Imaging and Procedures for  05/25/20  OCT, Retina - OU - Both Eyes       Right Eye Quality was good. Scan locations included subfoveal. Central Foveal Thickness: 298. Progression has been stable. Findings include normal foveal contour.   Left Eye Quality was good. Scan locations included subfoveal. Central Foveal Thickness: 296. Progression has been stable. Findings include normal foveal contour.   Notes Incidental posterior vitreous detachment OU  No active maculopathy                ASSESSMENT/PLAN:  Retinal holes, bilateral No new breaks today will observe  Posterior vitreous detachment of both eyes   The nature of posterior vitreous detachment was discussed with the patient as well as its physiology, its age prevalence, and its possible implication regarding retinal breaks and detachment.  An informational brochure was given to the patient.  All the patient's questions were answered.  The patient was asked to return if new or different flashes or floaters develops.   Patient was instructed to contact office immediately if any changes were noticed. I explained to the patient that vitreous inside the eye is similar to jello inside a bowl. As the jello melts it can start to pull away from the bowl, similarly the vitreous throughout our lives can begin to pull away from the retina. That process is called a posterior vitreous detachment. In some cases, the vitreous can tug hard enough on the retina to form a retinal tear. I discussed with the patient the signs and symptoms of a retinal detachment.  Do not rub the eye.  Lattice degeneration of peripheral retina, bilateral Will observe OU  Posterior capsular opacification, right Posterior capsule the right eye appears mostly clear yet does appear intact.  Findings by slit-lamp could in fact be the anterior hyaloid face yet there is a small capsule opacification nasal to the visual axis which is under the care of Dr. Izell [place]      ICD-10-CM   1.  Posterior vitreous detachment of both eyes  H43.813 OCT, Retina - OU - Both Eyes  2. Retinal holes, bilateral  H33.323   3. Lattice degeneration of peripheral retina, bilateral  H35.413   4. Posterior capsular opacification, right  H26.491     1.  History of bilateral retinal tears and holes with symptoms.  No new symptoms.  No new findings of retinal hole or break in either eye.  2.  No active maculopathy.  3.  Ophthalmic Meds Ordered this visit:  No orders of the defined types were placed in this encounter.      Return in about 1 year (around 05/25/2021) for DILATE OU, OCT.  There are no Patient Instructions on file for this visit.   Explained the diagnoses, plan, and follow up with the patient and they expressed understanding.  Patient expressed understanding of the importance of proper follow up care.   Alford Highland Leyli Kevorkian M.D. Diseases & Surgery of the Retina and Vitreous [facility] 05/25/20     Abbreviations: M myopia (nearsighted); A astigmatism; H hyperopia (farsighted); P presbyopia; Mrx spectacle prescription;  CTL contact lenses; OD right eye; OS left eye; OU both eyes  XT exotropia; ET esotropia; PEK punctate epithelial keratitis; PEE punctate epithelial erosions; DES dry eye syndrome; MGD meibomian gland dysfunction; ATs artificial tears; PFAT's preservative free artificial tears; NSC nuclear sclerotic cataract; PSC posterior subcapsular cataract; ERM epi-retinal membrane; PVD posterior vitreous detachment; RD retinal detachment; DM diabetes mellitus; DR diabetic retinopathy; NPDR non-proliferative diabetic retinopathy; PDR proliferative diabetic retinopathy; CSME clinically significant macular edema; DME diabetic macular edema; dbh dot blot hemorrhages; CWS cotton wool spot; POAG primary open angle glaucoma; C/D cup-to-disc ratio; HVF humphrey visual field; GVF goldmann visual field; OCT optical coherence tomography; IOP intraocular pressure; BRVO Branch  retinal vein occlusion; CRVO central retinal vein occlusion; CRAO central retinal artery occlusion; BRAO branch retinal artery occlusion; RT retinal tear; SB scleral buckle; PPV pars plana vitrectomy; VH Vitreous hemorrhage; PRP panretinal laser photocoagulation; IVK intravitreal kenalog; VMT vitreomacular traction; MH Macular hole;  NVD neovascularization of the disc; NVE neovascularization elsewhere; AREDS age related eye disease study; ARMD age related macular degeneration; POAG primary open angle glaucoma; EBMD epithelial/anterior basement membrane dystrophy; ACIOL anterior chamber intraocular lens; IOL intraocular lens; PCIOL posterior chamber intraocular lens; Phaco/IOL phacoemulsification with intraocular lens placement; PRK photorefractive keratectomy; LASIK laser assisted in situ keratomileusis; HTN hypertension; DM diabetes mellitus; COPD chronic obstructive pulmonary disease

## 2020-07-22 ENCOUNTER — Ambulatory Visit
Admission: RE | Admit: 2020-07-22 | Discharge: 2020-07-22 | Disposition: A | Discharge status: Home / Self Care | Payer: Medicare HMO | Source: Ambulatory Visit | Attending: Family Medicine | Admitting: Family Medicine

## 2020-07-22 ENCOUNTER — Other Ambulatory Visit: Payer: Self-pay

## 2020-07-22 ENCOUNTER — Other Ambulatory Visit: Payer: Self-pay | Admitting: Family Medicine

## 2020-07-22 DIAGNOSIS — M79641 Pain in right hand: Secondary | ICD-10-CM

## 2021-05-25 ENCOUNTER — Encounter (INDEPENDENT_AMBULATORY_CARE_PROVIDER_SITE_OTHER): Payer: Self-pay | Admitting: Ophthalmology

## 2021-05-25 ENCOUNTER — Other Ambulatory Visit: Payer: Self-pay

## 2021-05-25 ENCOUNTER — Ambulatory Visit (INDEPENDENT_AMBULATORY_CARE_PROVIDER_SITE_OTHER): Payer: Medicare HMO | Admitting: Ophthalmology

## 2021-05-25 DIAGNOSIS — H43813 Vitreous degeneration, bilateral: Secondary | ICD-10-CM

## 2021-05-25 DIAGNOSIS — H35413 Lattice degeneration of retina, bilateral: Secondary | ICD-10-CM

## 2021-05-25 DIAGNOSIS — H33323 Round hole, bilateral: Secondary | ICD-10-CM

## 2021-05-25 NOTE — Assessment & Plan Note (Signed)
No new retinal breaks 

## 2021-05-25 NOTE — Progress Notes (Signed)
05/25/2021     CHIEF COMPLAINT Patient presents for  Chief Complaint  Patient presents with   Retina Evaluation      HISTORY OF PRESENT ILLNESS: Juan Moore is a 71 y.o. male who presents to the clinic today for:   HPI     Retina Evaluation           Laterality: both eyes   MD Performed: performed the HPI with the patient and updated documentation appropriately         Comments   Follow-up history of bilateral retinal holes as well as lattice degeneration, no interval symptoms change over the last 1 year      Last edited by Edmon Crape, MD on 05/25/2021  8:21 AM.      Referring physician: Mateo Flow, MD [address] [place],  [place] [address]  HISTORICAL INFORMATION:   Selected notes from the MEDICAL RECORD NUMBER       CURRENT MEDICATIONS: No current outpatient medications on file. (Ophthalmic Drugs)   No current facility-administered medications for this visit. (Ophthalmic Drugs)   Current Outpatient Medications (Other)  Medication Sig   acetaminophen (TYLENOL) 500 MG tablet Take 1,000 mg by mouth every 6 (six) hours as needed for headache (pain).   ascorbic acid (VITAMIN C) 500 MG tablet Take 500 mg by mouth daily.   hydrocortisone (ANUSOL-HC) 25 MG suppository Place 25 mg rectally 2 (two) times daily.   Menthol, Topical Analgesic, (ICY HOT EX) Apply 1 application topically daily as needed (shoulder pain).   Multiple Vitamin (MULTIVITAMIN WITH MINERALS) TABS tablet Take 1 tablet by mouth daily.   Probiotic Product (ALIGN PO) Take 1 tablet by mouth.   simvastatin (ZOCOR) 40 MG tablet Take 40 mg by mouth at bedtime.    No current facility-administered medications for this visit. (Other)      REVIEW OF SYSTEMS: ROS   Negative for: Constitutional, Gastrointestinal, Neurological, Skin, Genitourinary, Musculoskeletal, HENT, Endocrine, Cardiovascular, Eyes, Respiratory, Psychiatric, Allergic/Imm, Heme/Lymph Last edited by Edmon Crape, MD on 05/25/2021  8:35 AM.       ALLERGIES No Known Allergies  PAST MEDICAL HISTORY History reviewed. No pertinent past medical history. History reviewed. No pertinent surgical history.  FAMILY HISTORY History reviewed. No pertinent family history.  SOCIAL HISTORY Social History   Tobacco Use   Smoking status: Never   Smokeless tobacco: Never  Substance Use Topics   Alcohol use: Not Currently   Drug use: Not Currently         OPHTHALMIC EXAM:  Base Eye Exam     Visual Acuity (ETDRS)       Right Left   Dist cc 20/20 20/20 -1    Correction: Glasses         Tonometry (Tonopen, 8:20 AM)       Right Left   Pressure 8 8         Pupils       Pupils APD   Right PERRL None   Left PERRL None         Visual Fields       Left Right    Full Full         Extraocular Movement       Right Left    Full, Ortho Full, Ortho         Neuro/Psych     Oriented x3: Yes         Dilation     Both eyes: 1.0%  Mydriacyl, 2.5% Phenylephrine @ 8:20 AM           Slit Lamp and Fundus Exam     External Exam       Right Left   External Normal Normal         Slit Lamp Exam       Right Left   Lids/Lashes Normal Normal   Conjunctiva/Sclera White and quiet White and quiet   Cornea Clear Clear   Anterior Chamber Deep and quiet Deep and quiet   Iris Round and reactive Round and reactive   Lens Centered posterior chamber intraocular lens, 1+ Posterior capsular opacification, but mostly clear, clear sequestered fluid Centered posterior chamber intraocular lens, Open posterior capsule   Anterior Vitreous Normal Normal         Fundus Exam       Right Left   Posterior Vitreous Posterior vitreous detachment Normal   Disc Small optic nerve head, hypoplastic Peripapillary atrophy, Hypoplasia   C/D Ratio 0.1 0.1   Macula Normal Normal   Vessels Normal Normal   Periphery Lattice degeneration, old break 11 o'clock, good pexy, Chorioretinal scar  at 7, no new breaks good pexy 12-1, 5, 6, no new breaks            IMAGING AND PROCEDURES  Imaging and Procedures for 05/25/21  OCT, Retina - OU - Both Eyes       Right Eye Quality was good. Scan locations included subfoveal. Central Foveal Thickness: 304. Progression has been stable. Findings include normal foveal contour.   Left Eye Quality was good. Scan locations included subfoveal. Central Foveal Thickness: 303. Progression has been stable. Findings include normal foveal contour.   Notes Incidental posterior vitreous detachment OU  No active maculopathy     Color Fundus Photography Optos - OU - Both Eyes       Right Eye Progression has been stable. Disc findings include normal observations. Macula : normal observations. Vessels : normal observations.   Left Eye Progression has been stable. Disc findings include normal observations. Macula : normal observations. Vessels : normal observations.   Notes OS with chorioretinal scars at 1 5 and 6  OD with chorioretinal scars 11 6 and 7             ASSESSMENT/PLAN:  Retinal holes, bilateral No new retinal breaks.  Posterior vitreous detachment of both eyes Stable OU  Lattice degeneration of peripheral retina, bilateral No new retinal breaks     ICD-10-CM   1. Lattice degeneration of peripheral retina, bilateral  H35.413 Color Fundus Photography Optos - OU - Both Eyes    2. Posterior vitreous detachment of both eyes  H43.813 OCT, Retina - OU - Both Eyes    3. Retinal holes, bilateral  H33.323       1.  OU stable, no new retinal holes or tears.  2.  Follow-up Dr. Herbert Deaner as scheduled.  3.  Follow-up here in 1 year, I recommend that he did separate the 2 appointments by 6 months to get the improved value of monitoring over time  Ophthalmic Meds Ordered this visit:  No orders of the defined types were placed in this encounter.      Return in about 1 year (around 05/25/2022) for OCT, DILATE  OU.  There are no Patient Instructions on file for this visit.   Explained the diagnoses, plan, and follow up with the patient and they expressed understanding.  Patient expressed understanding of the importance of proper follow  up care.   Clent Demark Ervan Heber M.D. Diseases & Surgery of the Retina and Vitreous [facility] 05/25/21     Abbreviations: M myopia (nearsighted); A astigmatism; H hyperopia (farsighted); P presbyopia; Mrx spectacle prescription;  CTL contact lenses; OD right eye; OS left eye; OU both eyes  XT exotropia; ET esotropia; PEK punctate epithelial keratitis; PEE punctate epithelial erosions; DES dry eye syndrome; MGD meibomian gland dysfunction; ATs artificial tears; PFAT's preservative free artificial tears; [place] nuclear sclerotic cataract; PSC posterior subcapsular cataract; ERM epi-retinal membrane; PVD posterior vitreous detachment; RD retinal detachment; DM diabetes mellitus; DR diabetic retinopathy; NPDR non-proliferative diabetic retinopathy; PDR proliferative diabetic retinopathy; CSME clinically significant macular edema; DME diabetic macular edema; dbh dot blot hemorrhages; CWS cotton wool spot; POAG primary open angle glaucoma; C/D cup-to-disc ratio; HVF humphrey visual field; GVF goldmann visual field; OCT optical coherence tomography; IOP intraocular pressure; BRVO Branch retinal vein occlusion; CRVO central retinal vein occlusion; CRAO central retinal artery occlusion; BRAO branch retinal artery occlusion; RT retinal tear; SB scleral buckle; PPV pars plana vitrectomy; VH Vitreous hemorrhage; PRP panretinal laser photocoagulation; IVK intravitreal kenalog; VMT vitreomacular traction; MH Macular hole;  NVD neovascularization of the disc; NVE neovascularization elsewhere; AREDS age related eye disease study; ARMD age related macular degeneration; POAG primary open angle glaucoma; EBMD epithelial/anterior basement membrane dystrophy; ACIOL anterior chamber  intraocular lens; IOL intraocular lens; PCIOL posterior chamber intraocular lens; Phaco/IOL phacoemulsification with intraocular lens placement; [place] photorefractive keratectomy; LASIK laser assisted in situ keratomileusis; HTN hypertension; DM diabetes mellitus; COPD chronic obstructive pulmonary disease

## 2021-05-25 NOTE — Assessment & Plan Note (Signed)
Stable OU 

## 2021-07-02 ENCOUNTER — Other Ambulatory Visit: Payer: Self-pay

## 2021-07-02 ENCOUNTER — Other Ambulatory Visit: Payer: Self-pay | Admitting: Family Medicine

## 2021-07-02 ENCOUNTER — Ambulatory Visit
Admission: RE | Admit: 2021-07-02 | Discharge: 2021-07-02 | Disposition: A | Discharge status: Home / Self Care | Payer: Medicare HMO | Source: Ambulatory Visit | Attending: Family Medicine | Admitting: Family Medicine

## 2021-07-02 DIAGNOSIS — M79671 Pain in right foot: Secondary | ICD-10-CM

## 2021-07-06 ENCOUNTER — Encounter (INDEPENDENT_AMBULATORY_CARE_PROVIDER_SITE_OTHER): Payer: Medicare HMO | Admitting: Ophthalmology

## 2021-09-13 ENCOUNTER — Encounter: Payer: Self-pay | Admitting: Physician Assistant

## 2021-09-13 ENCOUNTER — Ambulatory Visit: Payer: Medicare HMO | Admitting: Physician Assistant

## 2021-09-13 DIAGNOSIS — M7671 Peroneal tendinitis, right leg: Secondary | ICD-10-CM | POA: Diagnosis not present

## 2021-09-13 NOTE — Progress Notes (Signed)
HPI: Juan Moore comes in today with right foot pain has been ongoing for a few months.  Pain is periodic.  No injuries.  States there is no particular shoewear or activity due to the fact that the pain on the lateral aspect of his right foot.  He has no pain in the left foot.  Ranks his pain to be 3 out of 10 pain at worst.  Points to the lateral aspect of the foot is source of his pain. Radiographs 2 views of the right foot are reviewed on the [facility] system and showed no acute fractures.  No bony abnormalities.  No subluxations dislocations throughout the foot.  Bipartite sesamoid bones noted.  Review of systems see HPI otherwise negative noncontributory.  Physical exam: General well-developed well-nourished male no acute distress.  Ambulates without any assistive device.  Psych: Alert and oriented x3  Bilateral feet: No rashes skin lesions ulcerations ecchymosis erythema.  Full dorsiflexion plantarflexion bilateral ankles without pain.  Inversion eversion against resistance causes no pain and reveals 5 out of 5 strength bilaterally.  Nontender over the Achilles bilaterally.  Nontender over the posterior tibial tendons peroneal tendons bilaterally except for the right foot where he is tender over the insertion of the peroneus brevis at fifth metatarsal base.  Impression: Right peroneus brevis insertional tendinitis    Plan we will try a facility orthotic with lateral arch support to offload the lateral column.  Needs to be good about shoewear changes shoes out daily.  Apply Voltaren gel to the area of maximal tenderness 2 g 4 times daily.  If he fails this conservative treatment would recommend formal physical therapy he will call and let us know if he wants to go to therapy.  Therapy will be for stretching, modalities and home exercise program.  Questions were encouraged and answered

## 2022-05-26 ENCOUNTER — Encounter (INDEPENDENT_AMBULATORY_CARE_PROVIDER_SITE_OTHER): Payer: Medicare HMO | Admitting: Ophthalmology

## 2022-06-16 ENCOUNTER — Encounter: Payer: Self-pay | Admitting: Radiology

## 2022-08-08 ENCOUNTER — Encounter (INDEPENDENT_AMBULATORY_CARE_PROVIDER_SITE_OTHER): Payer: Self-pay

## 2022-08-08 ENCOUNTER — Encounter (INDEPENDENT_AMBULATORY_CARE_PROVIDER_SITE_OTHER): Payer: Medicare HMO | Admitting: Ophthalmology

## 2023-10-20 IMAGING — CR DG FOOT 2V*R*
2 series · 2 of 2 positions shown · non-contrast
Comparison: None.

CLINICAL DATA: Atraumatic, intermittent pain at the lateral aspect
of the fifth right metatarsal x 3-4 months.

EXAM:
RIGHT FOOT - 2 VIEW

[x foot ap right]
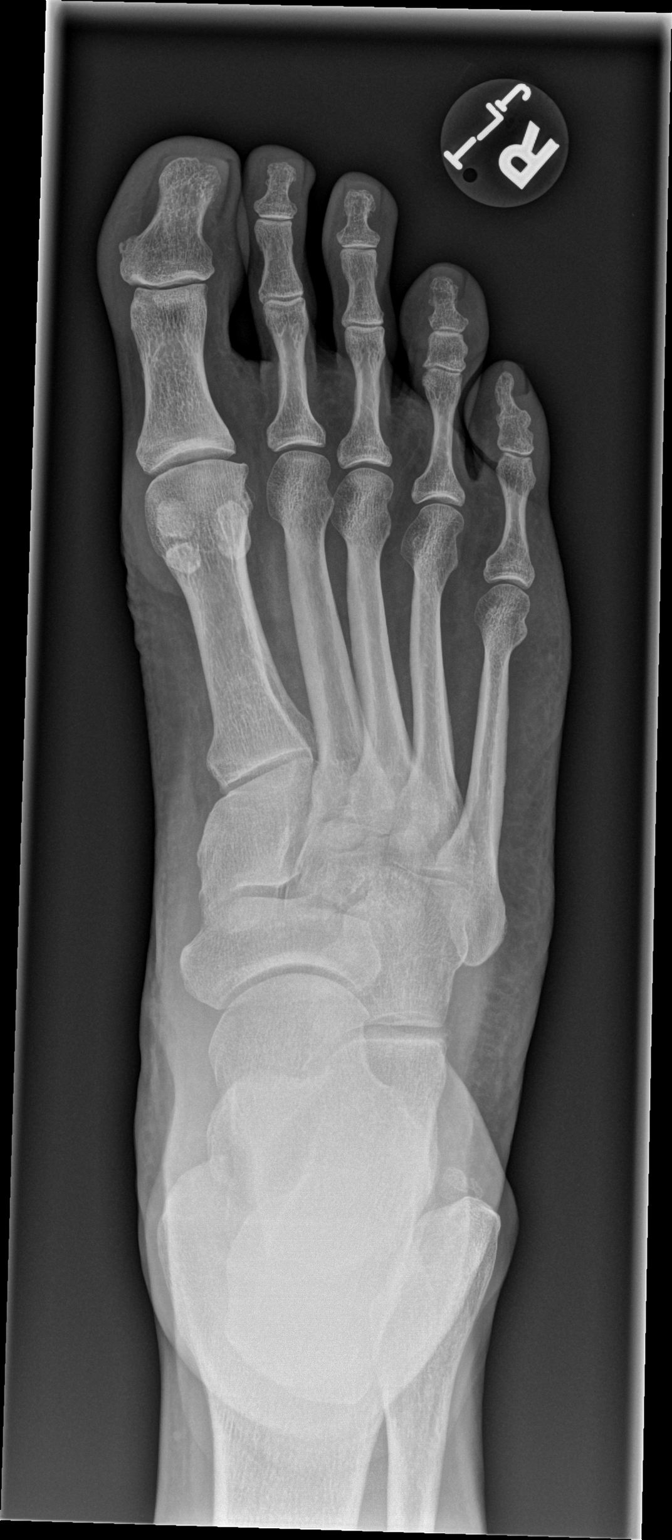

[x foot lat right]
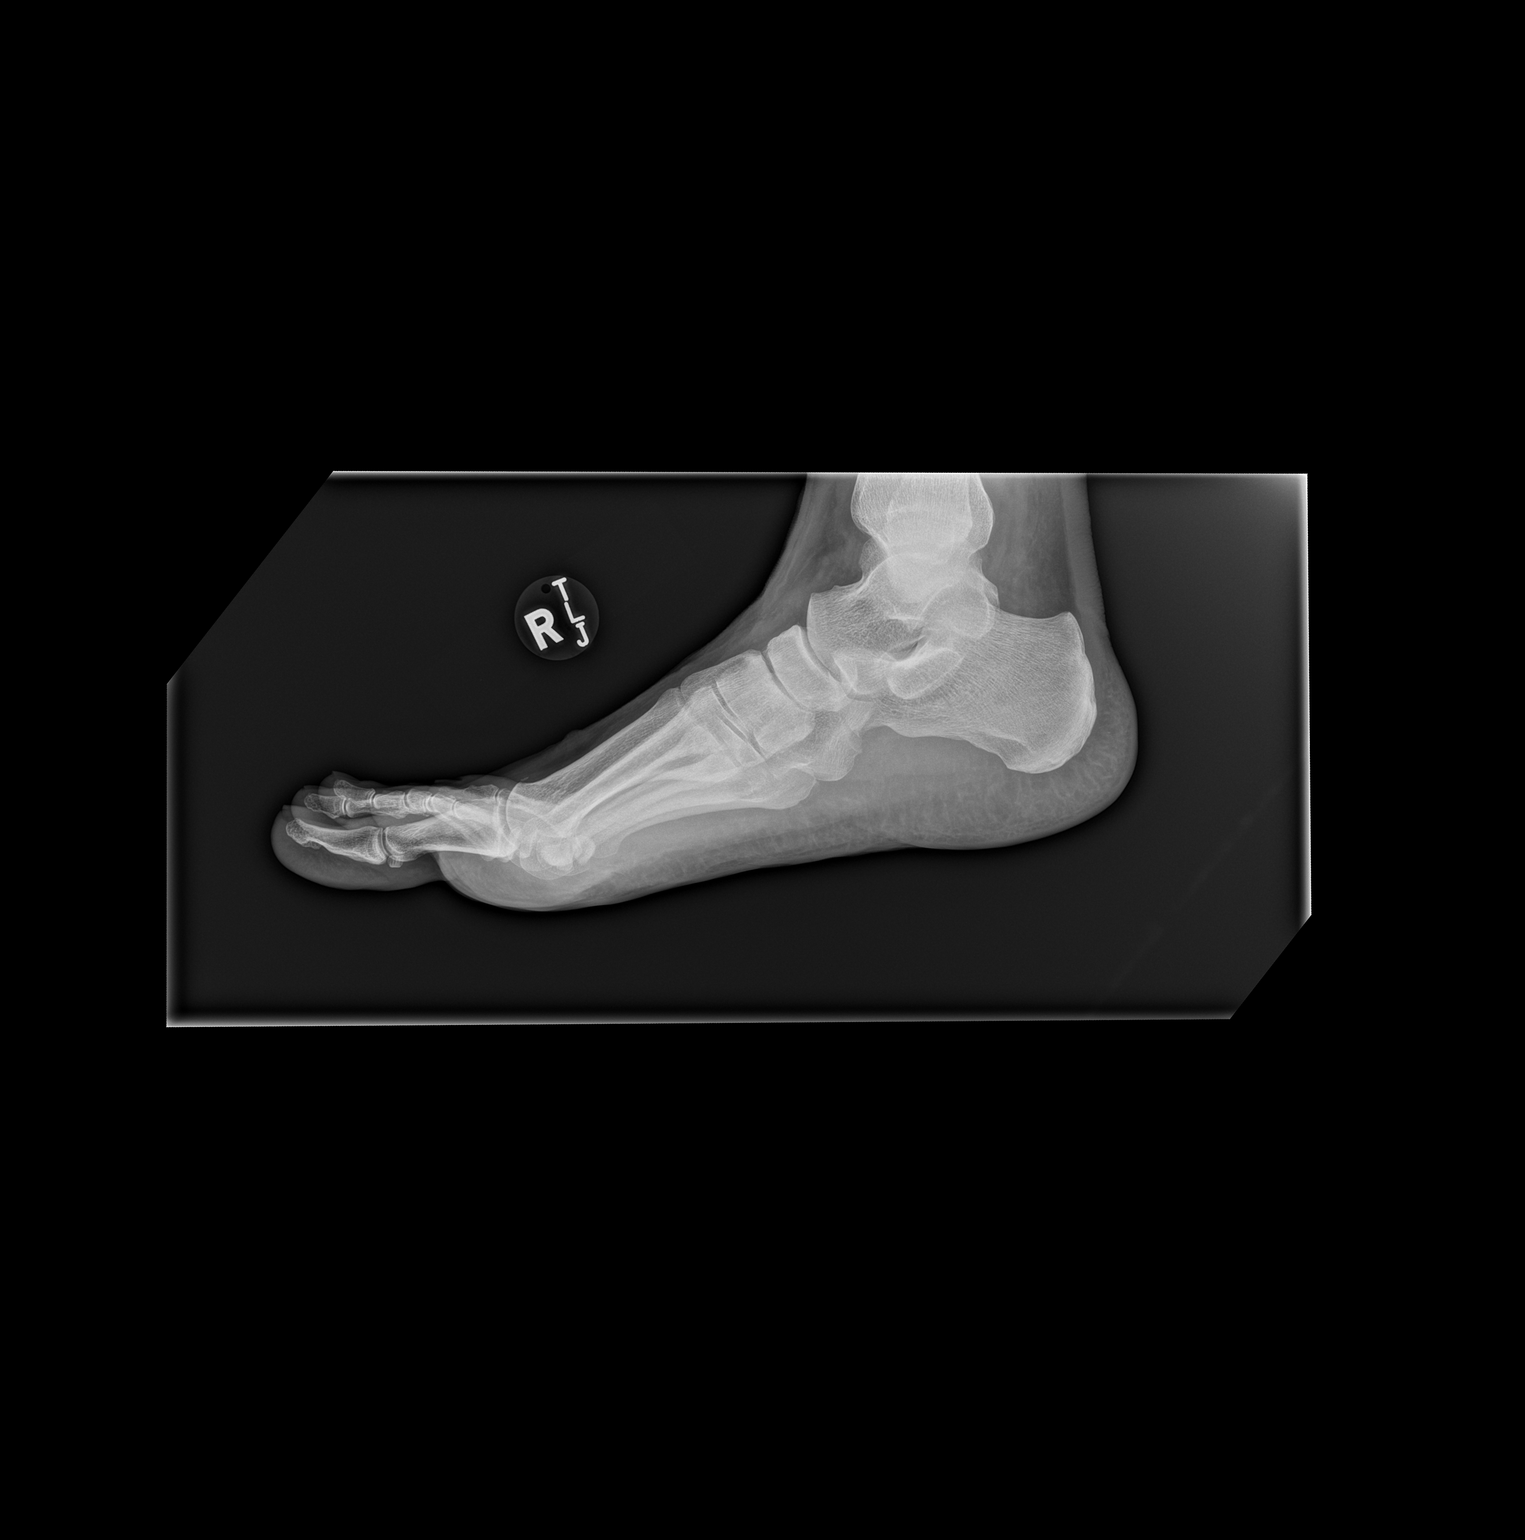

[2 of 2 positions shown; findings below may reference images not displayed]

FINDINGS: There is no evidence of fracture or dislocation. There is no
evidence of arthropathy or other focal bone abnormality. Soft
tissues are unremarkable.
IMPRESSION: Negative.
